# Patient Record
Sex: Male | Born: 1935 | Race: White | Hispanic: No | Marital: Married | Smoking: Former smoker
Health system: Southern US, Community
[De-identification: ages and names within clinical notes are randomized; demographics above are authoritative.]

## PROBLEM LIST (undated history)

## (undated) DIAGNOSIS — I4891 Unspecified atrial fibrillation: Secondary | ICD-10-CM

## (undated) DIAGNOSIS — I10 Essential (primary) hypertension: Secondary | ICD-10-CM

## (undated) DIAGNOSIS — Z9889 Other specified postprocedural states: Secondary | ICD-10-CM

## (undated) DIAGNOSIS — M1990 Unspecified osteoarthritis, unspecified site: Secondary | ICD-10-CM

## (undated) DIAGNOSIS — R112 Nausea with vomiting, unspecified: Secondary | ICD-10-CM

## (undated) DIAGNOSIS — E079 Disorder of thyroid, unspecified: Secondary | ICD-10-CM

## (undated) DIAGNOSIS — K219 Gastro-esophageal reflux disease without esophagitis: Secondary | ICD-10-CM

## (undated) DIAGNOSIS — H269 Unspecified cataract: Secondary | ICD-10-CM

## (undated) HISTORY — DX: Unspecified cataract: H26.9

## (undated) HISTORY — PX: FRACTURE SURGERY: SHX138

## (undated) HISTORY — PX: OTHER SURGICAL HISTORY: SHX169

## (undated) HISTORY — DX: Unspecified atrial fibrillation: I48.91

## (undated) HISTORY — DX: Disorder of thyroid, unspecified: E07.9

## (undated) HISTORY — DX: Unspecified osteoarthritis, unspecified site: M19.90

## (undated) HISTORY — PX: JOINT REPLACEMENT: SHX530

---

## 2006-01-19 ENCOUNTER — Ambulatory Visit (HOSPITAL_BASED_OUTPATIENT_CLINIC_OR_DEPARTMENT_OTHER): Admission: RE | Admit: 2006-01-19 | Discharge: 2006-01-19 | Payer: Self-pay | Admitting: Orthopedic Surgery

## 2006-10-26 HISTORY — PX: OTHER SURGICAL HISTORY: SHX169

## 2012-07-07 ENCOUNTER — Other Ambulatory Visit: Payer: Self-pay | Admitting: Orthopedic Surgery

## 2012-07-28 ENCOUNTER — Encounter (HOSPITAL_COMMUNITY)
Admission: RE | Admit: 2012-07-28 | Discharge: 2012-07-28 | Disposition: A | Discharge status: Home / Self Care | Payer: Medicare Other | Source: Ambulatory Visit | Attending: Orthopedic Surgery | Admitting: Orthopedic Surgery

## 2012-07-28 ENCOUNTER — Encounter (HOSPITAL_COMMUNITY): Payer: Self-pay

## 2012-07-28 HISTORY — DX: Essential (primary) hypertension: I10

## 2012-07-28 HISTORY — DX: Other specified postprocedural states: R11.2

## 2012-07-28 HISTORY — DX: Gastro-esophageal reflux disease without esophagitis: K21.9

## 2012-07-28 HISTORY — DX: Other specified postprocedural states: Z98.890

## 2012-07-28 LAB — CBC WITH DIFFERENTIAL/PLATELET
Basophils Relative: 1 % (ref 0–1)
Eosinophils Absolute: 0.2 10*3/uL (ref 0.0–0.7)
Eosinophils Relative: 4 % (ref 0–5)
MCH: 30.5 pg (ref 26.0–34.0)
MCHC: 33.9 g/dL (ref 30.0–36.0)
MCV: 90 fL (ref 78.0–100.0)
Monocytes Relative: 17 % — ABNORMAL HIGH (ref 3–12)
Neutrophils Relative %: 55 % (ref 43–77)
Platelets: 196 10*3/uL (ref 150–400)

## 2012-07-28 LAB — COMPREHENSIVE METABOLIC PANEL
Albumin: 4 g/dL (ref 3.5–5.2)
Alkaline Phosphatase: 70 U/L (ref 39–117)
BUN: 15 mg/dL (ref 6–23)
Calcium: 9.6 mg/dL (ref 8.4–10.5)
GFR calc Af Amer: >90 mL/min (ref >90)
Glucose, Bld: 111 mg/dL — ABNORMAL HIGH (ref 70–99)
Potassium: 4 mEq/L (ref 3.5–5.1)
Total Protein: 7.1 g/dL (ref 6.0–8.3)

## 2012-07-28 LAB — SURGICAL PCR SCREEN: MRSA, PCR: NEGATIVE

## 2012-07-28 LAB — URINALYSIS, ROUTINE W REFLEX MICROSCOPIC
Glucose, UA: NEGATIVE mg/dL
Ketones, ur: NEGATIVE mg/dL
Leukocytes, UA: NEGATIVE
Nitrite: NEGATIVE
Protein, ur: NEGATIVE mg/dL
Urobilinogen, UA: 0.2 mg/dL (ref 0.0–1.0)

## 2012-07-28 LAB — PROTIME-INR
INR: 0.99 (ref 0.00–1.49)
Prothrombin Time: 13 seconds (ref 11.6–15.2)

## 2012-07-28 LAB — APTT: aPTT: 34 seconds (ref 24–37)

## 2012-07-28 NOTE — Progress Notes (Signed)
Pt requested txm dos

## 2012-07-28 NOTE — Pre-Procedure Instructions (Signed)
20 Jared Benjamin  07/28/2012   Your procedure is scheduled on:  08/09/12  Report to [facility] at 530 AM.  Call this number if you have problems the morning of surgery: [phone]   Remember:   Do not eat food:After Midnight.    Take these medicines the morning of surgery with A SIP OF WATER: zantac   Do not wear jewelry, make-up or nail polish.  Do not wear lotions, powders, or perfumes. You may wear deodorant.  Do not shave 48 hours prior to surgery. Men may shave face and neck.  Do not bring valuables to the hospital.  Contacts, dentures or bridgework may not be worn into surgery.  Leave suitcase in the car. After surgery it may be brought to your room.  For patients admitted to the hospital, checkout time is 11:00 AM the day of discharge.   Patients discharged the day of surgery will not be allowed to drive home.  Name and phone number of your driver: [email or web address]  Special Instructions: Shower using CHG 2 nights before surgery and the night before surgery.  If you shower the day of surgery use CHG.  Use special wash - you have one bottle of CHG for all showers.  You should use approximately 1/3 of the bottle for each shower.   Please read over the following fact sheets that you were given: Pain Booklet, Coughing and Deep Breathing, Blood Transfusion Information, Total Joint Packet, MRSA Information and Surgical Site Infection Prevention

## 2012-07-29 ENCOUNTER — Other Ambulatory Visit (HOSPITAL_COMMUNITY): Payer: Self-pay

## 2012-08-08 MED ORDER — CEFAZOLIN SODIUM-DEXTROSE 2-3 GM-% IV SOLR
2.0000 g | INTRAVENOUS | Status: AC
  Administered 2012-08-09: 2 g via INTRAVENOUS
  Filled 2012-08-08: qty 50

## 2012-08-08 MED ORDER — POVIDONE-IODINE 7.5 % EX SOLN
Freq: Once | CUTANEOUS | Status: DC
  Filled 2012-08-08: qty 118

## 2012-08-09 ENCOUNTER — Encounter (HOSPITAL_COMMUNITY): Payer: Self-pay | Admitting: *Deleted

## 2012-08-09 ENCOUNTER — Encounter (HOSPITAL_COMMUNITY)
Admission: RE | Disposition: A | Discharge status: Home / Self Care | Payer: Self-pay | Source: Ambulatory Visit | Attending: Orthopedic Surgery

## 2012-08-09 ENCOUNTER — Inpatient Hospital Stay (HOSPITAL_COMMUNITY): Payer: Medicare Other

## 2012-08-09 ENCOUNTER — Encounter (HOSPITAL_COMMUNITY): Payer: Self-pay | Admitting: Certified Registered"

## 2012-08-09 ENCOUNTER — Inpatient Hospital Stay (HOSPITAL_COMMUNITY)
Admission: RE | Admit: 2012-08-09 | Discharge: 2012-08-11 | DRG: 484 | Disposition: A | Discharge status: Home / Self Care | Payer: Medicare Other | Source: Ambulatory Visit | Attending: Orthopedic Surgery | Admitting: Orthopedic Surgery

## 2012-08-09 ENCOUNTER — Inpatient Hospital Stay (HOSPITAL_COMMUNITY): Payer: Medicare Other | Admitting: Certified Registered"

## 2012-08-09 DIAGNOSIS — Z23 Encounter for immunization: Secondary | ICD-10-CM

## 2012-08-09 DIAGNOSIS — Z87891 Personal history of nicotine dependence: Secondary | ICD-10-CM

## 2012-08-09 DIAGNOSIS — Z7982 Long term (current) use of aspirin: Secondary | ICD-10-CM

## 2012-08-09 DIAGNOSIS — Z79899 Other long term (current) drug therapy: Secondary | ICD-10-CM

## 2012-08-09 DIAGNOSIS — M19019 Primary osteoarthritis, unspecified shoulder: Secondary | ICD-10-CM

## 2012-08-09 DIAGNOSIS — M19219 Secondary osteoarthritis, unspecified shoulder: Principal | ICD-10-CM | POA: Diagnosis present

## 2012-08-09 DIAGNOSIS — Z01812 Encounter for preprocedural laboratory examination: Secondary | ICD-10-CM

## 2012-08-09 DIAGNOSIS — I10 Essential (primary) hypertension: Secondary | ICD-10-CM | POA: Diagnosis present

## 2012-08-09 DIAGNOSIS — K219 Gastro-esophageal reflux disease without esophagitis: Secondary | ICD-10-CM | POA: Diagnosis present

## 2012-08-09 HISTORY — PX: TOTAL SHOULDER ARTHROPLASTY: SHX126

## 2012-08-09 LAB — ABO/RH: ABO/RH(D): O POS

## 2012-08-09 LAB — TYPE AND SCREEN: Antibody Screen: NEGATIVE

## 2012-08-09 SURGERY — ARTHROPLASTY, SHOULDER, TOTAL
Anesthesia: General | Site: Shoulder | Laterality: Left | Wound class: Clean

## 2012-08-09 MED ORDER — DEXAMETHASONE SODIUM PHOSPHATE 4 MG/ML IJ SOLN
INTRAMUSCULAR | Status: DC | PRN
  Administered 2012-08-09: 4 mg via INTRAVENOUS

## 2012-08-09 MED ORDER — MORPHINE SULFATE 2 MG/ML IJ SOLN: 2.0000 mg | INTRAMUSCULAR | Status: DC | PRN

## 2012-08-09 MED ORDER — ROCURONIUM BROMIDE 100 MG/10ML IV SOLN
INTRAVENOUS | Status: DC | PRN
  Administered 2012-08-09 (×3): 10 mg via INTRAVENOUS
  Administered 2012-08-09: 50 mg via INTRAVENOUS

## 2012-08-09 MED ORDER — ACETAMINOPHEN 650 MG RE SUPP: 650.0000 mg | Freq: Four times a day (QID) | RECTAL | Status: DC | PRN

## 2012-08-09 MED ORDER — CEFAZOLIN SODIUM-DEXTROSE 2-3 GM-% IV SOLR
2.0000 g | Freq: Four times a day (QID) | INTRAVENOUS | Status: AC
  Administered 2012-08-09 – 2012-08-10 (×3): 2 g via INTRAVENOUS
  Filled 2012-08-09 (×3): qty 50

## 2012-08-09 MED ORDER — PHENYLEPHRINE HCL 10 MG/ML IJ SOLN
INTRAMUSCULAR | Status: DC | PRN
  Administered 2012-08-09 (×2): 80 ug via INTRAVENOUS
  Administered 2012-08-09 (×4): 40 ug via INTRAVENOUS
  Administered 2012-08-09: 80 ug via INTRAVENOUS
  Administered 2012-08-09 (×2): 40 ug via INTRAVENOUS
  Administered 2012-08-09: 80 ug via INTRAVENOUS
  Administered 2012-08-09: 40 ug via INTRAVENOUS
  Administered 2012-08-09: 80 ug via INTRAVENOUS
  Administered 2012-08-09: 40 ug via INTRAVENOUS

## 2012-08-09 MED ORDER — FENTANYL CITRATE 0.05 MG/ML IJ SOLN
INTRAMUSCULAR | Status: DC | PRN
  Administered 2012-08-09 (×2): 100 ug via INTRAVENOUS

## 2012-08-09 MED ORDER — PROPOFOL 10 MG/ML IV BOLUS
INTRAVENOUS | Status: DC | PRN
  Administered 2012-08-09: 110 mg via INTRAVENOUS

## 2012-08-09 MED ORDER — ONDANSETRON HCL 4 MG/2ML IJ SOLN
INTRAMUSCULAR | Status: DC | PRN
  Administered 2012-08-09 (×2): 4 mg via INTRAVENOUS

## 2012-08-09 MED ORDER — METOCLOPRAMIDE HCL 10 MG PO TABS: 5.0000 mg | ORAL_TABLET | Freq: Three times a day (TID) | ORAL | Status: DC | PRN

## 2012-08-09 MED ORDER — MENTHOL 3 MG MT LOZG: 1.0000 | LOZENGE | OROMUCOSAL | Status: DC | PRN

## 2012-08-09 MED ORDER — OXYCODONE-ACETAMINOPHEN 5-325 MG PO TABS
1.0000 | ORAL_TABLET | ORAL | Status: DC | PRN
  Administered 2012-08-10: 1 via ORAL
  Filled 2012-08-09: qty 1

## 2012-08-09 MED ORDER — BUPIVACAINE-EPINEPHRINE PF 0.5-1:200000 % IJ SOLN
INTRAMUSCULAR | Status: DC | PRN
  Administered 2012-08-09: 30 mL

## 2012-08-09 MED ORDER — SODIUM CHLORIDE 0.9 % IV SOLN
INTRAVENOUS | Status: DC
  Administered 2012-08-09: 14:00:00 via INTRAVENOUS

## 2012-08-09 MED ORDER — FAMOTIDINE 20 MG PO TABS
20.0000 mg | ORAL_TABLET | Freq: Every day | ORAL | Status: DC
  Administered 2012-08-09 – 2012-08-11 (×3): 20 mg via ORAL
  Filled 2012-08-09 (×3): qty 1

## 2012-08-09 MED ORDER — MIDAZOLAM HCL 5 MG/5ML IJ SOLN
INTRAMUSCULAR | Status: DC | PRN
  Administered 2012-08-09: 2 mg via INTRAVENOUS

## 2012-08-09 MED ORDER — SIMVASTATIN 40 MG PO TABS
40.0000 mg | ORAL_TABLET | Freq: Every day | ORAL | Status: DC
  Administered 2012-08-09 – 2012-08-10 (×2): 40 mg via ORAL
  Filled 2012-08-09 (×3): qty 1

## 2012-08-09 MED ORDER — BENAZEPRIL HCL 20 MG PO TABS
20.0000 mg | ORAL_TABLET | Freq: Every day | ORAL | Status: DC
  Administered 2012-08-09 – 2012-08-10 (×2): 20 mg via ORAL
  Filled 2012-08-09 (×4): qty 1

## 2012-08-09 MED ORDER — INFLUENZA VIRUS VACC SPLIT PF IM SUSP
0.5000 mL | INTRAMUSCULAR | Status: AC
  Administered 2012-08-10: 0.5 mL via INTRAMUSCULAR
  Filled 2012-08-09: qty 0.5

## 2012-08-09 MED ORDER — ONDANSETRON HCL 4 MG/2ML IJ SOLN: 4.0000 mg | Freq: Four times a day (QID) | INTRAMUSCULAR | Status: DC | PRN

## 2012-08-09 MED ORDER — LACTATED RINGERS IV SOLN
INTRAVENOUS | Status: DC | PRN
  Administered 2012-08-09 (×3): via INTRAVENOUS

## 2012-08-09 MED ORDER — ONDANSETRON HCL 4 MG PO TABS: 4.0000 mg | ORAL_TABLET | Freq: Four times a day (QID) | ORAL | Status: DC | PRN

## 2012-08-09 MED ORDER — MEPERIDINE HCL 25 MG/ML IJ SOLN: 6.2500 mg | INTRAMUSCULAR | Status: DC | PRN

## 2012-08-09 MED ORDER — SENNA 8.6 MG PO TABS
1.0000 | ORAL_TABLET | Freq: Two times a day (BID) | ORAL | Status: DC
  Administered 2012-08-09 – 2012-08-10 (×4): 8.6 mg via ORAL
  Filled 2012-08-09 (×6): qty 1

## 2012-08-09 MED ORDER — METOCLOPRAMIDE HCL 5 MG/ML IJ SOLN
5.0000 mg | Freq: Three times a day (TID) | INTRAMUSCULAR | Status: DC | PRN
  Administered 2012-08-09: 10 mg via INTRAVENOUS
  Filled 2012-08-09: qty 2

## 2012-08-09 MED ORDER — EPHEDRINE SULFATE 50 MG/ML IJ SOLN
INTRAMUSCULAR | Status: DC | PRN
  Administered 2012-08-09: 10 mg via INTRAVENOUS
  Administered 2012-08-09: 5 mg via INTRAVENOUS
  Administered 2012-08-09: 10 mg via INTRAVENOUS
  Administered 2012-08-09: 5 mg via INTRAVENOUS
  Administered 2012-08-09: 10 mg via INTRAVENOUS
  Administered 2012-08-09 (×2): 5 mg via INTRAVENOUS

## 2012-08-09 MED ORDER — ACETAMINOPHEN 325 MG PO TABS
650.0000 mg | ORAL_TABLET | Freq: Four times a day (QID) | ORAL | Status: DC | PRN
  Administered 2012-08-10 – 2012-08-11 (×3): 650 mg via ORAL
  Filled 2012-08-09 (×3): qty 2

## 2012-08-09 MED ORDER — NEOSTIGMINE METHYLSULFATE 1 MG/ML IJ SOLN
INTRAMUSCULAR | Status: DC | PRN
  Administered 2012-08-09: 3 mg via INTRAVENOUS

## 2012-08-09 MED ORDER — SODIUM CHLORIDE 0.9 % IR SOLN
Status: DC | PRN
  Administered 2012-08-09: 1000 mL
  Administered 2012-08-09: 3000 mL

## 2012-08-09 MED ORDER — FLEET ENEMA 7-19 GM/118ML RE ENEM: 1.0000 | ENEMA | Freq: Once | RECTAL | Status: AC | PRN

## 2012-08-09 MED ORDER — ARTIFICIAL TEARS OP OINT
TOPICAL_OINTMENT | OPHTHALMIC | Status: DC | PRN
  Administered 2012-08-09: 1 via OPHTHALMIC

## 2012-08-09 MED ORDER — BISACODYL 5 MG PO TBEC: 5.0000 mg | DELAYED_RELEASE_TABLET | Freq: Every day | ORAL | Status: DC | PRN

## 2012-08-09 MED ORDER — GLYCOPYRROLATE 0.2 MG/ML IJ SOLN
INTRAMUSCULAR | Status: DC | PRN
  Administered 2012-08-09: 0.4 mg via INTRAVENOUS

## 2012-08-09 MED ORDER — HYDROMORPHONE HCL PF 1 MG/ML IJ SOLN
0.2500 mg | INTRAMUSCULAR | Status: DC | PRN
Start: 2012-08-09 — End: 2012-08-09

## 2012-08-09 MED ORDER — ONDANSETRON HCL 4 MG/2ML IJ SOLN: 4.0000 mg | Freq: Once | INTRAMUSCULAR | Status: DC | PRN

## 2012-08-09 MED ORDER — LIDOCAINE HCL (CARDIAC) 20 MG/ML IV SOLN
INTRAVENOUS | Status: DC | PRN
  Administered 2012-08-09: 100 mg via INTRAVENOUS

## 2012-08-09 MED ORDER — ALUM & MAG HYDROXIDE-SIMETH 200-200-20 MG/5ML PO SUSP: 30.0000 mL | ORAL | Status: DC | PRN

## 2012-08-09 MED ORDER — ZOLPIDEM TARTRATE 5 MG PO TABS: 5.0000 mg | ORAL_TABLET | Freq: Every evening | ORAL | Status: DC | PRN

## 2012-08-09 MED ORDER — HYDROCODONE-ACETAMINOPHEN 5-325 MG PO TABS: 1.0000 | ORAL_TABLET | ORAL | Status: DC | PRN

## 2012-08-09 MED ORDER — ASPIRIN EC 325 MG PO TBEC
325.0000 mg | DELAYED_RELEASE_TABLET | Freq: Two times a day (BID) | ORAL | Status: DC
  Administered 2012-08-09 – 2012-08-10 (×4): 325 mg via ORAL
  Filled 2012-08-09 (×7): qty 1

## 2012-08-09 MED ORDER — OXYCODONE HCL 5 MG PO TABS: 5.0000 mg | ORAL_TABLET | Freq: Once | ORAL | Status: DC | PRN

## 2012-08-09 MED ORDER — ALUM & MAG HYDROXIDE-SIMETH 200-200-20 MG/5ML PO SUSP
30.0000 mL | Freq: Four times a day (QID) | ORAL | Status: DC | PRN
  Administered 2012-08-09: 30 mL via ORAL
  Filled 2012-08-09: qty 30

## 2012-08-09 MED ORDER — OXYCODONE HCL 5 MG/5ML PO SOLN: 5.0000 mg | Freq: Once | ORAL | Status: DC | PRN

## 2012-08-09 MED ORDER — PHENOL 1.4 % MT LIQD: 1.0000 | OROMUCOSAL | Status: DC | PRN

## 2012-08-09 MED ORDER — HEMOSTATIC AGENTS (NO CHARGE) OPTIME
TOPICAL | Status: DC | PRN
  Administered 2012-08-09: 1 via TOPICAL

## 2012-08-09 MED ORDER — SENNOSIDES-DOCUSATE SODIUM 8.6-50 MG PO TABS: 1.0000 | ORAL_TABLET | Freq: Every evening | ORAL | Status: DC | PRN

## 2012-08-09 MED ORDER — PNEUMOCOCCAL VAC POLYVALENT 25 MCG/0.5ML IJ INJ
0.5000 mL | INJECTION | INTRAMUSCULAR | Status: AC
  Filled 2012-08-09: qty 0.5

## 2012-08-09 MED ORDER — DIPHENHYDRAMINE HCL 12.5 MG/5ML PO ELIX: 12.5000 mg | ORAL_SOLUTION | ORAL | Status: DC | PRN

## 2012-08-09 SURGICAL SUPPLY — 70 items
BLADE SAW SAG 73X25 THK (BLADE) ×1
BLADE SAW SGTL 73X25 THK (BLADE) ×1 IMPLANT
BLADE SURG 15 STRL LF DISP TIS (BLADE) ×1 IMPLANT
BLADE SURG 15 STRL SS (BLADE) ×4
BOWL SMART MIX CTS (DISPOSABLE) IMPLANT
CEMENT BONE DEPUY (Cement) ×2 IMPLANT
CHLORAPREP W/TINT 26ML (MISCELLANEOUS) ×2 IMPLANT
CLOTH BEACON ORANGE TIMEOUT ST (SAFETY) ×2 IMPLANT
CLSR STERI-STRIP ANTIMIC 1/2X4 (GAUZE/BANDAGES/DRESSINGS) ×1 IMPLANT
COVER SURGICAL LIGHT HANDLE (MISCELLANEOUS) ×2 IMPLANT
DRAPE INCISE IOBAN 66X45 STRL (DRAPES) ×3 IMPLANT
DRAPE SURG 17X23 STRL (DRAPES) ×2 IMPLANT
DRAPE U-SHAPE 47X51 STRL (DRAPES) ×2 IMPLANT
DRSG ADAPTIC 3X8 NADH LF (GAUZE/BANDAGES/DRESSINGS) ×2 IMPLANT
DRSG MEPILEX BORDER 4X12 (GAUZE/BANDAGES/DRESSINGS) ×1 IMPLANT
DRSG PAD ABDOMINAL 8X10 ST (GAUZE/BANDAGES/DRESSINGS) ×3 IMPLANT
ELECT BLADE 4.0 EZ CLEAN MEGAD (MISCELLANEOUS) ×2
ELECT REM PT RETURN 9FT ADLT (ELECTROSURGICAL) ×2
ELECTRODE BLDE 4.0 EZ CLN MEGD (MISCELLANEOUS) ×1 IMPLANT
ELECTRODE REM PT RTRN 9FT ADLT (ELECTROSURGICAL) ×1 IMPLANT
EVACUATOR 1/8 PVC DRAIN (DRAIN) ×2 IMPLANT
GLOVE BIO SURGEON STRL SZ7 (GLOVE) ×2 IMPLANT
GLOVE BIO SURGEON STRL SZ7.5 (GLOVE) ×2 IMPLANT
GLOVE BIOGEL PI IND STRL 7.0 (GLOVE) ×1 IMPLANT
GLOVE BIOGEL PI IND STRL 8 (GLOVE) ×1 IMPLANT
GLOVE BIOGEL PI INDICATOR 7.0 (GLOVE) ×1
GLOVE BIOGEL PI INDICATOR 8 (GLOVE) ×1
GOWN PREVENTION PLUS LG XLONG (DISPOSABLE) ×2 IMPLANT
GOWN STRL REIN XL XLG (GOWN DISPOSABLE) ×2 IMPLANT
HANDPIECE INTERPULSE COAX TIP (DISPOSABLE) ×2
HEMOSTAT SURGICEL 2X14 (HEMOSTASIS) ×2 IMPLANT
HOOD PEEL AWAY FACE SHEILD DIS (HOOD) ×4 IMPLANT
KIT BASIN OR (CUSTOM PROCEDURE TRAY) ×2 IMPLANT
KIT ROOM TURNOVER OR (KITS) ×2 IMPLANT
MANIFOLD NEPTUNE II (INSTRUMENTS) ×2 IMPLANT
NDL HYPO 25GX1X1/2 BEV (NEEDLE) ×1 IMPLANT
NDL MAYO TROCAR (NEEDLE) ×1 IMPLANT
NEEDLE HYPO 25GX1X1/2 BEV (NEEDLE) ×2 IMPLANT
NEEDLE MAYO TROCAR (NEEDLE) ×2 IMPLANT
NS IRRIG 1000ML POUR BTL (IV SOLUTION) ×2 IMPLANT
PACK SHOULDER (CUSTOM PROCEDURE TRAY) ×2 IMPLANT
PAD ARMBOARD 7.5X6 YLW CONV (MISCELLANEOUS) ×4 IMPLANT
SET HNDPC FAN SPRY TIP SCT (DISPOSABLE) ×1 IMPLANT
SLING ARM IMMOBILIZER LRG (SOFTGOODS) ×2 IMPLANT
SLING ARM IMMOBILIZER MED (SOFTGOODS) IMPLANT
SLING SWATHE LARGE (SOFTGOODS) ×1 IMPLANT
SMARTMIX MINI TOWER (MISCELLANEOUS) ×2
SPONGE GAUZE 4X4 12PLY (GAUZE/BANDAGES/DRESSINGS) ×2 IMPLANT
SPONGE LAP 18X18 X RAY DECT (DISPOSABLE) ×2 IMPLANT
SPONGE LAP 4X18 X RAY DECT (DISPOSABLE) ×6 IMPLANT
STRIP CLOSURE SKIN 1/2X4 (GAUZE/BANDAGES/DRESSINGS) ×2 IMPLANT
SUCTION FRAZIER TIP 10 FR DISP (SUCTIONS) ×2 IMPLANT
SUPPORT WRAP ARM LG (MISCELLANEOUS) ×2 IMPLANT
SUT ETHIBOND 2 OS 4 DA (SUTURE) ×9 IMPLANT
SUT ETHIBOND NAB CT1 #1 30IN (SUTURE) ×2 IMPLANT
SUT MNCRL AB 4-0 PS2 18 (SUTURE) ×2 IMPLANT
SUT SILK 2 0 TIES 17X18 (SUTURE) ×2
SUT SILK 2-0 18XBRD TIE BLK (SUTURE) ×1 IMPLANT
SUT VIC AB 0 CTB1 27 (SUTURE) ×2 IMPLANT
SUT VIC AB 2-0 CT1 27 (SUTURE) ×2
SUT VIC AB 2-0 CT1 TAPERPNT 27 (SUTURE) ×1 IMPLANT
SYR CONTROL 10ML LL (SYRINGE) ×2 IMPLANT
SYRINGE TOOMEY DISP (SYRINGE) ×2 IMPLANT
TAPE CLOTH SURG 6X10 WHT LF (GAUZE/BANDAGES/DRESSINGS) ×1 IMPLANT
TOWEL OR 17X24 6PK STRL BLUE (TOWEL DISPOSABLE) ×2 IMPLANT
TOWEL OR 17X26 10 PK STRL BLUE (TOWEL DISPOSABLE) ×2 IMPLANT
TOWER SMARTMIX MINI (MISCELLANEOUS) ×1 IMPLANT
TRAY FOLEY CATH 14FR (SET/KITS/TRAYS/PACK) ×1 IMPLANT
WATER STERILE IRR 1000ML POUR (IV SOLUTION) ×2 IMPLANT
YANKAUER SUCT BULB TIP NO VENT (SUCTIONS) ×2 IMPLANT

## 2012-08-09 NOTE — Transfer of Care (Signed)
Immediate Anesthesia Transfer of Care Note  Patient: Jared Benjamin  Procedure(s) Performed: Procedure(s) (LRB) with comments: TOTAL SHOULDER ARTHROPLASTY (Left)  Patient Location: PACU  Anesthesia Type: General  Level of Consciousness: awake, oriented and responds to stimulation  Airway & Oxygen Therapy: Patient Spontanous Breathing and Patient connected to nasal cannula oxygen  Post-op Assessment: Report given to PACU RN  Post vital signs: Reviewed and stable  Complications: No apparent anesthesia complications

## 2012-08-09 NOTE — Preoperative (Signed)
Beta Blockers   Reason not to administer Beta Blockers:Not Applicable 

## 2012-08-09 NOTE — Op Note (Signed)
Procedure(s): TOTAL SHOULDER ARTHROPLASTY Procedure Note  Jared Benjamin male 77 y.o. 08/09/2012  Procedure(s) and Anesthesia Type:    * LEFT TOTAL SHOULDER ARTHROPLASTY - General with preoperative interscalene block  Surgeon(s) and Role:    * Mable Paris, MD - Primary   Indications:  77 y.o. male  With endstage left shoulder arthritis status post ORIF of a proximal humerus fracture 13 years ago.. Pain and dysfunction interfered with quality of life and nonoperative treatment with activity modification, NSAIDS and injections failed. X-rays show severe joint space narrowing and significant osteophyte formation.     Surgeon: Mable Paris   Assistants: Damita Lack PA-C (Danielle was scrubbed throughout the procedure and was essential for exposure retraction and positioning and closure)  Anesthesia: General endotracheal anesthesia with preoperative interscalene block    Procedure Detail  TOTAL SHOULDER ARTHROPLASTY  Findings: DePuy size 8 press-fit porocoat stem with a size 48 x 15 eccentric head and a 48 anchor peg glenoid cemented. The stem had to be placed slightly anteriorly and slightly varus that his anatomy status post proximal humerus fracture. Soft tissue tension was excellent at the conclusion of the procedure. A lesser tuberosity osteotomy was performed. Bone quality was excellent.  Estimated Blood Loss:  200 mL         Drains: 1 medium hemovac  Blood Given: none          Specimens: none        Complications:  * No complications entered in OR log *         Disposition: PACU - hemodynamically stable.         Condition: stable    Procedure:   The patient was identified in the preoperative holding area where I personally marked the operative extremity after verifying with the patient and consent. He  was taken to the operating room where He was transferred to the   operative table.  The patient received an interscalene block in   the  holding area by the attending anesthesiologist.  General anesthesia was induced   in the operating room without complication.  The patient did receive IV  Ancef prior to the commencement of the procedure.  The patient was   placed in the beach-chair position with the back raised about 30   degrees.  The nonoperative extremity and head and neck were carefully   positioned and padded protecting against neurovascular compromise.  The   left upper extremity was then prepped and draped in the standard sterile   fashion.    The appropriate operative time-out was performed with   Anesthesia, the perioperative staff, as well as myself and we all agreed   that the left side was the correct operative site.  An approximately   12 cm incision was made from the tip of the coracoid to the center point of the   humerus at the level of the axilla. His previous incision was marked out and used. Dissection was carried down sharply   through subcutaneous tissues and the deltopectoral interval was identified , but no identifiable cephalic vein was present..  The anatomic layers were carefully dissected. The pectoralis major was taken medially.  The   upper 1 cm of the pectoralis major was released from its attachment on   the humerus.  The clavipectoral fascia was incised just lateral to the   conjoined tendon.  This incision was carried up to but not into the   coracoacromial ligament.  Digital palpation was used to  prove   integrity of the axillary nerve which was protected throughout the   procedure.  Musculocutaneous nerve was not palpated in the operative   field.  Conjoined tendon was then retracted gently medially and the   deltoid laterally.  Anterior circumflex humeral vessels were not able to be identified.  The soft tissues overlying the biceps was incised and this   incision was carried across the transverse humeral ligament to the base   of the coracoid.  The biceps was tenodesed to the soft tissue  just above   pectoralis major and the remaining portion of the biceps superiorly was   excised.  An osteotomy was performed at the lesser tuberosity and the   subscapularis was freed from the underlying capsule.  Capsule was then   released all the way down to the 6 o'clock position of the humeral head. He had a large inferior humeral head osteophyte.  The humeral head was then delivered with simultaneous adduction,   extension and external rotation.  All humeral osteophytes were removed   and the anatomic neck of the humerus was marked and cut free hand at   approximately 25 degrees retroversion within about 2 mm of the cuff   reflection posteriorly.  The head size was estimated to be a 48 medium   offset.  At that point, the humeral head was retracted posteriorly with   a Fukuda retractor and the anterior-inferior capsule was excised.   Remaining portion of the capsule was released at the base of the   coracoid.  The remaining biceps anchor and the entire anterior-inferior   labrum was excised.  The posterior labrum was also excised but the   posterior capsule was not released.  The guidepin was placed bicortically with 0 elevated guide.  The reamer was used to ream to concentric bone with punctate bleeding.  This gave an excellent concentric surface.  The center hole was then drilled for an anchor peg glenoid followed by the three peripheral holes and none of the holes   exited the glenoid wall.  I then pulse irrigated these holes and dried   them with Surgicel.  The three peripheral holes were then   pressurized cemented and the anchor peg glenoid was placed and impacted   with an excellent fit.  The glenoid was a 48 component.  The proximal humerus was then again exposed taking care not to displace the glenoid.    The humerus was then sequentially reamed going from 6 to 8 by 2 mm incriments.  The entry hole was made slightly anterior given the abnormal anatomy. The 8 mm reamer was found to  have appropriate cortical contact.  A   box osteotome was then used and a 8-mm broach.  The broach handle was   removed and the trial head was placed.   Calcar reamer was used.The eccentric  40 x 15 head fit best.  With the trial implantation of the component, there was   approximately 50% posterior translation with immediate snap back to the   anatomic position.  The 48 x 18 trial was a bit too tight. With forward elevation, there was no tendency   towards posterior subluxation.   The trial was removed and the final implant was prepared on a back table.  The implant was then impacted and   achieved excellent anatomic reconstruction of the proximal humerus.  #2 Ethibond was placed around the neck prior to impaction.  The joint   was then  copiously irrigated with pulse lavage.  The subscapularis and   lesser tuberosity osteotomy were then repaired using 2 #2 Ethibonds   and the #2 Ethibond around the neck of the implant in a double row type   repair.  One #1 Ethibond was placed at the rotator interval just above   the lesser tuberosity.  After repair of the lesser tuberosity, a medium   Hemovac was placed out anterolaterally and again copious irrigation was   used.   Skin was closed with 2-0 Vicryl sutures in the deep dermal layer and 4-0 Monocryl in a subcuticular  running fashion.  Sterile dressings were then applied including Steri- Strips, 4x4s, ABDs and tape.  The patient was placed in a sling and allowed to awaken from general anesthesia and taken to the recovery room in stable  condition.      POSTOPERATIVE PLAN:  Early passive range of motion will be allowed with the goal of 0 degrees external rotation to protect the subscapularis repair and a 130 degrees forward elevation.  No internal rotation at this time.  No active motion of the arm until the lesser tuberosity heels.  The patient will likely be kept in the hospital for 1-2 days and then discharged home.

## 2012-08-09 NOTE — H&P (Signed)
Jared Benjamin is an 77 y.o. male.   Chief Complaint: L shoulder pain HPI: S/p ORIF L prox hum fx in 1990, with subsequent OA.  Now with significant constant pain which has failed NSAIDs, activity modification and injection therapy.  XRS show severe joint space narrowing.  Past Medical History  Diagnosis Date  . PONV (postoperative nausea and vomiting)   . Hypertension     dr c Jared Benjamin  . GERD (gastroesophageal reflux disease)     Past Surgical History  Procedure Date  . Bone spurs     off toes  . Fell     shoulder X 2 surgeried re. to fall  . Joint replacement     broke rt ankle--fall  . Fractured ribs     from [facility]    History reviewed. No pertinent family history. Social History:  reports that he has quit smoking. He quit smokeless tobacco use about 34 years ago. He reports that he drinks alcohol. His drug history not on file.  Allergies: No Known Allergies  Medications Prior to Admission  Medication Sig Dispense Refill  . aspirin EC 81 MG tablet Take 81 mg by mouth daily.      . benazepril (LOTENSIN) 20 MG tablet Take 20 mg by mouth daily.      . LUTEIN PO Take 1 tablet by mouth daily.      . ranitidine (ZANTAC) 150 MG tablet Take 150 mg by mouth daily as needed. For acid reflux      . simvastatin (ZOCOR) 40 MG tablet Take 40 mg by mouth at bedtime.        Results for orders placed during the hospital encounter of 08/09/12 (from the past 48 hour(s))  TYPE AND SCREEN     Status: Normal (Preliminary result)   Collection Time   08/09/12  6:30 AM      Component Value Range Comment   ABO/RH(D) O POS      Antibody Screen PENDING      Sample Expiration 08/12/2012      No results found.  Review of Systems  All other systems reviewed and are negative.    Blood pressure 160/84, pulse 73, temperature 97.8 F (36.6 C), temperature source Oral, resp. rate 18, SpO2 98.00%. Physical Exam  Constitutional: He is oriented to person, place, and time. He appears well-developed  and well-nourished.  HENT:  Head: Atraumatic.  Eyes: EOM are normal.  Cardiovascular: Intact distal pulses.   Respiratory: Effort normal.  Musculoskeletal:       Left shoulder: He exhibits decreased range of motion, tenderness and pain.  Neurological: He is alert and oriented to person, place, and time.  Skin: Skin is warm and dry.  Psychiatric: He has a normal mood and affect.     Assessment/Plan L shoulder post traumatic OA, failed conservative treatment Plan L TSA Risks / benefits of surgery discussed Consent on chart  NPO for OR Preop antibiotics   Jared Benjamin 08/09/2012, 7:15 AM

## 2012-08-09 NOTE — Anesthesia Preprocedure Evaluation (Addendum)
Anesthesia Evaluation  Patient identified by MRN, date of birth, ID band Patient awake    Reviewed: Allergy & Precautions, H&P , NPO status , Patient's Chart, lab work & pertinent test results  History of Anesthesia Complications (+) PONV  Airway Mallampati: II TM Distance: >3 FB Neck ROM: Full    Dental  (+) Teeth Intact and Dental Advisory Given   Pulmonary  breath sounds clear to auscultation        Cardiovascular hypertension, Pt. on medications Rhythm:Regular Rate:Normal     Neuro/Psych    GI/Hepatic GERD-  Controlled,  Endo/Other    Renal/GU      Musculoskeletal   Abdominal   Peds  Hematology   Anesthesia Other Findings   Reproductive/Obstetrics                           Anesthesia Physical Anesthesia Plan  ASA: II  Anesthesia Plan: General   Post-op Pain Management:    Induction: Intravenous  Airway Management Planned: Oral ETT  Additional Equipment:   Intra-op Plan:   Post-operative Plan: Extubation in OR  Informed Consent: I have reviewed the patients History and Physical, chart, labs and discussed the procedure including the risks, benefits and alternatives for the proposed anesthesia with the patient or authorized representative who has indicated his/her understanding and acceptance.     Plan Discussed with: CRNA and Surgeon  Anesthesia Plan Comments:         Anesthesia Quick Evaluation

## 2012-08-09 NOTE — Anesthesia Procedure Notes (Addendum)
Anesthesia Regional Block:  Interscalene brachial plexus block  Pre-Anesthetic Checklist: ,, timeout performed, Correct Patient, Correct Site, Correct Laterality, Correct Procedure, Correct Position, site marked, Risks and benefits discussed,  Surgical consent,  Pre-op evaluation,  At surgeon's request and post-op pain management  Laterality: Left  Prep: chloraprep       Needles:  Injection technique: Single-shot  Needle Type: Echogenic Stimulator Needle     Needle Length: 5cm 5 cm     Additional Needles:  Procedures: ultrasound guided and nerve stimulator Interscalene brachial plexus block  Nerve Stimulator or Paresthesia:  Response: 0.4 mA,   Additional Responses:   Narrative:  Start time: 08/09/2012 7:10 AM End time: 08/09/2012 7:25 AM Injection made incrementally with aspirations every 5 mL. Anesthesiologist: Arta Bruce MD  Additional Notes: Monitors applied. Patient sedated. Sterile prep and drape,hand hygiene and sterile gloves were used. Relevant anatomy identified.Needle position confirmed.Local anesthetic injected incrementally after negative aspiration. Local anesthetic spread visualized around nerve(s). Vascular puncture avoided. No complications. Image printed for medical record.The patient tolerated the procedure well.       Interscalene brachial plexus block Procedure Name: Intubation Date/Time: 08/09/2012 7:45 AM Performed by: Jefm Miles E Pre-anesthesia Checklist: Patient identified, Timeout performed, Emergency Drugs available, Suction available and Patient being monitored Patient Re-evaluated:Patient Re-evaluated prior to inductionOxygen Delivery Method: Circle system utilized Preoxygenation: Pre-oxygenation with 100% oxygen Intubation Type: IV induction Ventilation: Mask ventilation without difficulty Laryngoscope Size: Mac and 3 Grade View: Grade II Tube type: Oral Tube size: 7.5 mm Number of attempts: 1 Airway Equipment and Method:  Stylet Placement Confirmation: ETT inserted through vocal cords under direct vision,  breath sounds checked- equal and bilateral and positive ETCO2 Secured at: 23 cm Tube secured with: Tape Dental Injury: Teeth and Oropharynx as per pre-operative assessment

## 2012-08-09 NOTE — Plan of Care (Signed)
Problem: Diagnosis - Type of Surgery Goal: General Surgical Patient Education (See Patient Education module for education specifics) Left Total Shoulder

## 2012-08-09 NOTE — Anesthesia Postprocedure Evaluation (Signed)
Anesthesia Post Note  Patient: Jared Benjamin  Procedure(s) Performed: Procedure(s) (LRB): TOTAL SHOULDER ARTHROPLASTY (Left)  Anesthesia type: general  Patient location: PACU  Post pain: Pain level controlled  Post assessment: Patient's Cardiovascular Status Stable  Last Vitals:  Filed Vitals:   08/09/12 1130  BP:   Pulse: 71  Temp:   Resp: 14    Post vital signs: Reviewed and stable  Level of consciousness: sedated  Complications: No apparent anesthesia complications

## 2012-08-10 LAB — BASIC METABOLIC PANEL
BUN: 10 mg/dL (ref 6–23)
CO2: 24 mEq/L (ref 19–32)
Calcium: 7 mg/dL — ABNORMAL LOW (ref 8.4–10.5)
Chloride: 111 mEq/L (ref 96–112)
Creatinine, Ser: 0.71 mg/dL (ref 0.50–1.35)

## 2012-08-10 LAB — CBC
HCT: 31 % — ABNORMAL LOW (ref 39.0–52.0)
MCH: 29 pg (ref 26.0–34.0)
MCV: 89.9 fL (ref 78.0–100.0)
RBC: 3.45 MIL/uL — ABNORMAL LOW (ref 4.22–5.81)
RDW: 14 % (ref 11.5–15.5)
WBC: 8.2 10*3/uL (ref 4.0–10.5)

## 2012-08-10 NOTE — Evaluation (Signed)
Occupational Therapy Evaluation Patient Details Name: Jared Benjamin MRN: [id number] DOB: 12/02/1934 Today's Date: 08/10/2012 Time: 2130-8657 OT Time Calculation (min): 37 min  OT Assessment / Plan / Recommendation Clinical Impression  Pt is recovering from a L TSA.  Instruction provided in ADL, positioning, sling use, AROM L elbow to hand, FF ROM in supine and restricted movements.  Will continue to follow.    OT Assessment  Patient needs continued OT Services    Follow Up Recommendations  Supervision - Intermittent    Barriers to Discharge      Equipment Recommendations  None recommended by OT    Recommendations for Other Services    Frequency  Min 2X/week    Precautions / Restrictions Precautions Precautions: Shoulder;Fall Type of Shoulder Precautions: NO ER, FF in supine to 130-140 only Precaution Booklet Issued: Yes (comment) Restrictions Weight Bearing Restrictions: Yes LUE Weight Bearing: Non weight bearing Other Position/Activity Restrictions: sling at all times except with bathing/dressing and exercises   Pertinent Vitals/Pain 3/10 L shoulder with exercises, 0/10 upon returning to supine    ADL  Eating/Feeding: Simulated;Set up Where Assessed - Eating/Feeding: Edge of bed Grooming: Performed;Wash/dry hands;Wash/dry face;Set up Where Assessed - Grooming: Unsupported sitting Upper Body Bathing: Simulated;Minimal assistance Where Assessed - Upper Body Bathing: Unsupported sitting Lower Body Bathing: Simulated;Supervision/safety Where Assessed - Lower Body Bathing: Supported sit to stand Upper Body Dressing: Performed;Minimal assistance Where Assessed - Upper Body Dressing: Unsupported sitting Lower Body Dressing: Performed;Supervision/safety Where Assessed - Lower Body Dressing: Supported sit to stand Equipment Used: Other (comment) (sling) Transfers/Ambulation Related to ADLs: Pt declined sitting in chair, supervision to reposition in bed  ADL Comments:  Instructed in hemitechniques for bathing and dressing, donning and doffing sling, positioning of L UE in bed and in chair.    OT Diagnosis: Generalized weakness;Acute pain  OT Problem List: Impaired UE functional use;Decreased knowledge of precautions;Decreased range of motion;Pain OT Treatment Interventions: Self-care/ADL training;Therapeutic exercise;Patient/family education   OT Goals Acute Rehab OT Goals OT Goal Formulation: With patient Time For Goal Achievement: 08/17/12 Potential to Achieve Goals: Good ADL Goals Pt Will Perform Upper Body Bathing: with supervision;Standing at sink ADL Goal: Upper Body Bathing - Progress: Goal set today Pt Will Perform Upper Body Dressing: with supervision;Sitting, bed ADL Goal: Upper Body Dressing - Progress: Goal set today Arm Goals Pt Will Perform AROM: 10 reps;to maintain range of motion;Independently (elbow to hand on L) Arm Goal: AROM - Progress: Goal set today Additional Arm Goal #1: Pt will be independent in FF ROM exercises of L shoulder in supine x 10 reps. Arm Goal: Additional Goal #1 - Progress: Goal set today Miscellaneous OT Goals Miscellaneous OT Goal #1: Pt and wife will be independent in L UE sling use and positioning in bed and chair. OT Goal: Miscellaneous Goal #1 - Progress: Goal set today  Visit Information  Last OT Received On: 08/10/12 Assistance Needed: +1    Subjective Data  Subjective: "This is overwhelming." Patient Stated Goal: Regain use of L shoulder.   Prior Functioning     Home Living Lives With: Spouse Available Help at Discharge: Family;Available 24 hours/day Type of Home: House Bathroom Shower/Tub: Walk-in shower;Door Dentist: None Prior Function Level of Independence: Independent Able to Take Stairs?: Yes Driving: Yes Vocation: Retired Comments: enjoys Data processing manager, singing in choir Communication Communication: No difficulties Dominant Hand: Right  (pt is somewhat ambidextrous)         Vision/Perception     IT consultant  Overall Cognitive Status: Appears within functional limits for tasks assessed/performed Arousal/Alertness: Awake/alert Orientation Level: Appears intact for tasks assessed Behavior During Session: [facility] for tasks performed    Extremity/Trunk Assessment Right Upper Extremity Assessment RUE ROM/Strength/Tone: Within functional levels Left Upper Extremity Assessment LUE ROM/Strength/Tone: Unable to fully assess;Due to precautions (shoulder AAROM in supine tolerated x 10 to 80 degrees ) Trunk Assessment Trunk Assessment: Normal     Mobility Bed Mobility Bed Mobility: Supine to Sit;Sit to Supine Supine to Sit: 6: Modified independent (Device/Increase time) Sit to Supine: 6: Modified independent (Device/Increase time) Transfers Transfers: Sit to Stand;Stand to Sit Sit to Stand: 5: Supervision;From bed Stand to Sit: 5: Supervision;To bed     Shoulder Instructions     Exercise     Balance     End of Session OT - End of Session Activity Tolerance: Patient tolerated treatment well Patient left: in bed;with call bell/phone within reach;Other (comment);with family/visitor present Radio producer in room)  GO     Evern Bio 08/10/2012, 10:51 AM [phone]

## 2012-08-10 NOTE — Progress Notes (Signed)
Referral received for SNF. Chart reviewed and CSW has spoken with [facility] who indicates that patient is for DC to home at d/c.  No current OT recommendations indicated.   CSW to sign off. Please re-consult if CSW needs arise.  Lorri Frederick. [place]  [phone]

## 2012-08-10 NOTE — Progress Notes (Signed)
PATIENT ID: Jared Benjamin   1 Day Post-Op Procedure(s) (LRB): TOTAL SHOULDER ARTHROPLASTY (Left)  Subjective: Feeling great. Up in bed. No complaints or concerns.   Objective:  Filed Vitals:   08/10/12 0718  BP: 132/54  Pulse: 82  Temp: 98.6 F (37 C)  Resp: 18     Alert and orientated L UE dressing with small drops of dry blood, intact Drain removed today Wiggles fingers, intact sensation to light touch distally, r/u/m nerves intact Fires deltoid well ER to neutral passively  Labs:  No results found for this basename: HGB:5 in the last 72 hoursNo results found for this basename: WBC:2,RBC:2,HCT:2,PLT:2 in the last 72 hoursNo results found for this basename: NA:2,K:2,CL:2,CO2:2,BUN:2,CREATININE:2,GLUCOSE:2,CALCIUM:2 in the last 72 hours  Assessment and Plan: Will work with OT today on PROM exercises, 130 degrees FF, 0 degrees ER Labs pending Continue current pain mgmt  VTE proph: ASA 325 BID, SCDs

## 2012-08-11 ENCOUNTER — Encounter (HOSPITAL_COMMUNITY): Payer: Self-pay | Admitting: Orthopedic Surgery

## 2012-08-11 DIAGNOSIS — M19019 Primary osteoarthritis, unspecified shoulder: Secondary | ICD-10-CM

## 2012-08-11 LAB — CBC
HCT: 33.8 % — ABNORMAL LOW (ref 39.0–52.0)
MCHC: 33.1 g/dL (ref 30.0–36.0)
MCV: 89.7 fL (ref 78.0–100.0)
RDW: 14.1 % (ref 11.5–15.5)

## 2012-08-11 LAB — BASIC METABOLIC PANEL
BUN: 13 mg/dL (ref 6–23)
Calcium: 8.3 mg/dL — ABNORMAL LOW (ref 8.4–10.5)
Creatinine, Ser: 0.78 mg/dL (ref 0.50–1.35)
GFR calc Af Amer: >90 mL/min (ref >90)
GFR calc non Af Amer: 86 mL/min — ABNORMAL LOW (ref >90)

## 2012-08-11 MED ORDER — OXYCODONE-ACETAMINOPHEN 5-325 MG PO TABS: 1.0000 | ORAL_TABLET | ORAL | Status: DC | PRN

## 2012-08-11 NOTE — Progress Notes (Signed)
Orthopedic Tech Progress Note Patient Details:  Jared Benjamin 06-19-1935 [id number] Sling immobilizer ordered to replace the one given in surgery because it did not have waist strap. Immobilizer applied to Left arm.  Ortho Devices Type of Ortho Device: Sling immobilizer Ortho Device/Splint Location: Left UE Ortho Device/Splint Interventions: Application   Asia R Thompson 08/11/2012, 11:03 AM

## 2012-08-11 NOTE — Progress Notes (Signed)
Occupational Therapy Treatment and Discharge Patient Details Name: Jared Benjamin MRN: [id number] DOB: Jul 05, 1935 Today's Date: 08/11/2012 Time: 0925-1000 OT Time Calculation (min): 35 min  OT Assessment / Plan / Recommendation Comments on Treatment Session Pt is ready for d/c.  Demonstrated and verbalized hemitechniques, home program, and restrictions.    Follow Up Recommendations  Supervision - Intermittent    Barriers to Discharge       Equipment Recommendations  None recommended by OT    Recommendations for Other Services    Frequency Min 2X/week   Plan Discharge plan remains appropriate    Precautions / Restrictions Precautions Precautions: Shoulder Type of Shoulder Precautions: NO ER, FF in supine to 130-140 only Precaution Booklet Issued: Yes (comment) Restrictions Weight Bearing Restrictions: Yes LUE Weight Bearing: Non weight bearing Other Position/Activity Restrictions: sling at all times except with bathing/dressing and exercises   Pertinent Vitals/Pain No pain    ADL  Upper Body Bathing: Performed;Set up Where Assessed - Upper Body Bathing: Unsupported sitting Upper Body Dressing: Performed;Supervision/safety Where Assessed - Upper Body Dressing: Unsupported sitting Toilet Transfer: Performed;Supervision/safety Toilet Transfer Method: Sit to stand Equipment Used: Other (comment) (shoulder sling/immobilizer) Transfers/Ambulation Related to ADLs: supervision to independent ADL Comments: Practiced hemitechniques for UE bathing and dressing. Call to [employer] for new shoulder sling, missing waist strap for immobilization    OT Diagnosis:    OT Problem List:   OT Treatment Interventions:     OT Goals ADL Goals Pt Will Perform Upper Body Bathing: with supervision;Standing at sink ADL Goal: Upper Body Bathing - Progress: Progressing toward goals Pt Will Perform Upper Body Dressing: with supervision;Sitting, bed ADL Goal: Upper Body Dressing - Progress:  Met Arm Goals Pt Will Perform AROM: 10 reps;to maintain range of motion;Independently Arm Goal: AROM - Progress: Met Additional Arm Goal #1: Pt will be independent in FF ROM exercises of L shoulder in supine x 10 reps. Arm Goal: Additional Goal #1 - Progress: Met Miscellaneous OT Goals Miscellaneous OT Goal #1: Pt and wife will be independent in L UE sling use and positioning in bed and chair. OT Goal: Miscellaneous Goal #1 - Progress: Met  Visit Information  Last OT Received On: 08/11/12 Assistance Needed: +1    Subjective Data      Prior Functioning       Cognition  Overall Cognitive Status: Appears within functional limits for tasks assessed/performed Arousal/Alertness: Awake/alert Orientation Level: Appears intact for tasks assessed Behavior During Session: [facility] for tasks performed    Mobility  Shoulder Instructions Bed Mobility Bed Mobility: Supine to Sit Supine to Sit: 7: Independent Transfers Transfers: Sit to Stand;Stand to Sit Sit to Stand: 7: Independent;From bed;From toilet Stand to Sit: 7: Independent;To chair/3-in-1;To toilet       Exercises      Balance     End of Session OT - End of Session Activity Tolerance: Patient tolerated treatment well Patient left: in chair;with family/visitor present;with call bell/phone within reach Nurse Communication: Other (comment) (agreed with need for new sling)  GO     Evern Bio 08/11/2012, 10:12 AM [phone]

## 2012-08-11 NOTE — Discharge Summary (Signed)
Patient ID: Jared Benjamin MRN: [id number] DOB/AGE: 1935/08/30 77 y.o.  Admit date: 08/09/2012 Discharge date: 08/11/2012  Admission Diagnoses:  Principal Problem:  *Shoulder arthritis   Discharge Diagnoses:  Same  Past Medical History  Diagnosis Date  . PONV (postoperative nausea and vomiting)   . Hypertension     dr c Sondra Come  . GERD (gastroesophageal reflux disease)     Surgeries: Procedure(s): TOTAL SHOULDER ARTHROPLASTY on 08/09/2012   Consultants:    Discharged Condition: Improved  Hospital Course: Jared Benjamin is an 77 y.o. male who was admitted 08/09/2012 for operative treatment ofShoulder arthritis. Patient has severe unremitting pain that affects sleep, daily activities, and work/hobbies. After pre-op clearance the patient was taken to the operating room on 08/09/2012 and underwent  Procedure(s): TOTAL SHOULDER ARTHROPLASTY.    Patient was given perioperative antibiotics: Anti-infectives     Start     Dose/Rate Route Frequency Ordered Stop   08/09/12 1400   ceFAZolin (ANCEF) IVPB 2 g/50 mL premix        2 g 100 mL/hr over 30 Minutes Intravenous Every 6 hours 08/09/12 1255 08/10/12 0230   08/08/12 1450   ceFAZolin (ANCEF) IVPB 2 g/50 mL premix        2 g 100 mL/hr over 30 Minutes Intravenous 60 min pre-op 08/08/12 1450 08/09/12 0748           Patient was given sequential compression devices, early ambulation, and ASA 325mg  BID to prevent DVT.  Patient benefited maximally from hospital stay and there were no complications.    Recent vital signs: Patient Vitals for the past 24 hrs:  BP Temp Temp src Pulse Resp SpO2  08/11/12 0649 141/65 mmHg 98.9 F (37.2 C) - 86  18  98 %  08/10/12 2345 132/65 mmHg 98.9 F (37.2 C) - 91  18  97 %  08/10/12 2130 150/73 mmHg 98.5 F (36.9 C) Oral 86  16  -  08/10/12 1410 140/64 mmHg 98 F (36.7 C) Oral 85  18  95 %  08/10/12 1100 127/58 mmHg 97.9 F (36.6 C) Oral 79  16  97 %     Recent laboratory studies:    [facility] 08/11/12 0655 08/10/12 0653  WBC 8.7 8.2  HGB 11.2* 10.0*  HCT 33.8* 31.0*  PLT 164 152  NA -- 140  K -- 3.2*  CL -- 111  CO2 -- 24  BUN -- 10  CREATININE -- 0.71  GLUCOSE -- 95  INR -- --  CALCIUM -- 7.0*     Discharge Medications:     Medication List     As of 08/11/2012  7:52 AM    TAKE these medications         aspirin EC 81 MG tablet   Take 81 mg by mouth daily.      benazepril 20 MG tablet   Commonly known as: LOTENSIN   Take 20 mg by mouth daily.      LUTEIN PO   Take 1 tablet by mouth daily.      oxyCODONE-acetaminophen 5-325 MG per tablet   Commonly known as: PERCOCET/ROXICET   Take 1-2 tablets by mouth every 4 (four) hours as needed for pain.      ranitidine 150 MG tablet   Commonly known as: ZANTAC   Take 150 mg by mouth daily as needed. For acid reflux      simvastatin 40 MG tablet   Commonly known as: ZOCOR   Take 40 mg  by mouth at bedtime.        Diagnostic Studies: Dg Chest 2 View  07/28/2012  *RADIOLOGY REPORT*  Clinical Data: Preop radiograph.  CHEST - 2 VIEW  Comparison: None  Findings: Moderate to marked arthropathic changes are noted involving the left glenohumeral joint.  Chronic appearing left posterior rib fracture deformities are identified.  Heart size appears normal.  No pleural effusion or edema.  Multilevel spondylosis identified within the thoracic spine.  IMPRESSION:  1.  No acute cardiopulmonary abnormalities. 2.  Chronic skeletal changes as above.   Original Report Authenticated By: Rosealee Albee, M.D.    Dg Shoulder 1v Left  08/09/2012  *RADIOLOGY REPORT*  Clinical Data: Post left shoulder arthroplasty  LEFT SHOULDER - 1 VIEW  Comparison: Chest radiograph - 07/28/2012  Findings: Post hemiarthroplasty of the left humerus.  No definite or dislocation fracture on this solitary AP provided projection.  A surgical drain overlies the superior lateral aspect of the operative site.  An additional surgical clip overlies the  glenoid process.  No radiopaque foreign body.  There is a minimal amount of expected intra-articular air.  Limited visualization of the adjacent thorax demonstrates multiple old/healed posterior lateral left-sided rib fractures.  IMPRESSION: Post left humeral hemiarthroplasty without definite complicating feature.   Original Report Authenticated By: Waynard Reeds, M.D.     Disposition: Final discharge disposition not confirmed      Discharge Orders    Future Orders Please Complete By Expires   Diet - low sodium heart healthy      Call MD / Call 911      Comments:   If you experience chest pain or shortness of breath, CALL 911 and be transported to the hospital emergency room.  If you develope a fever above 101 F, pus (white drainage) or increased drainage or redness at the wound, or calf pain, call your surgeon's office.   Constipation Prevention      Comments:   Drink plenty of fluids.  Prune juice may be helpful.  You may use a stool softener, such as Colace (over the counter) 100 mg twice a day.  Use MiraLax (over the counter) for constipation as needed.   Increase activity slowly as tolerated      Driving restrictions      Comments:   No driving until cleared by physician.   Lifting restrictions      Comments:   No lifting cleared by physician.      Follow-up Information    Follow up with Mable Paris, MD. Schedule an appointment as soon as possible for a visit in 10 days.   Contact information:   [facility] [address] [place] [address] [phone]           Signed: [name] 08/11/2012, 7:52 AM

## 2012-08-11 NOTE — Progress Notes (Signed)
PATIENT ID: Larose Kells   2 Days Post-Op Procedure(s) (LRB): TOTAL SHOULDER ARTHROPLASTY (Left)  Subjective: Doing well. Ready to go home, no complains or concerns. No dizziness, lightheadedness. Worked with OT and understands exercises.  Objective:  Filed Vitals:   08/11/12 0649  BP: 141/65  Pulse: 86  Temp: 98.9 F (37.2 C)  Resp: 18     Dressing removed, steri's intact. Wiggles fingers, intact sensation to light touch distally.  Labs:   [facility] 08/11/12 0655 08/10/12 0653  HGB 11.2* 10.0*   Basename 08/11/12 0655 08/10/12 0653  WBC 8.7 8.2  RBC 3.77* 3.45*  HCT 33.8* 31.0*  PLT 164 152   Basename 08/10/12 0653  NA 140  K 3.2*  CL 111  CO2 24  BUN 10  CREATININE 0.71  GLUCOSE 95  CALCIUM 7.0*    Assessment and Plan: 2 days s/p total shoulder arthroplasty, left Ready to go home Percocet for home pain control Fu with Dr Ave Filter in 10-14 days  VTE proph: SCDs, ASA 325mg  BID

## 2013-01-06 ENCOUNTER — Other Ambulatory Visit: Payer: Self-pay | Admitting: Gastroenterology

## 2013-01-09 ENCOUNTER — Encounter (HOSPITAL_COMMUNITY): Payer: Self-pay | Admitting: Pharmacy Technician

## 2013-01-09 NOTE — Progress Notes (Signed)
Spoke with Jared Benjamin this morning to obtain pre op history for Endoscopy procedure he plans to cancel his procedure until May.

## 2013-02-07 ENCOUNTER — Encounter (HOSPITAL_COMMUNITY): Payer: Self-pay | Admitting: *Deleted

## 2013-02-27 ENCOUNTER — Ambulatory Visit (HOSPITAL_COMMUNITY)
Admission: RE | Admit: 2013-02-27 | Discharge: 2013-02-27 | Disposition: A | Discharge status: Home / Self Care | Payer: Medicare Other | Source: Ambulatory Visit | Attending: Gastroenterology | Admitting: Gastroenterology

## 2013-02-27 ENCOUNTER — Encounter (HOSPITAL_COMMUNITY)
Admission: RE | Disposition: A | Discharge status: Home / Self Care | Payer: Self-pay | Source: Ambulatory Visit | Attending: Gastroenterology

## 2013-02-27 ENCOUNTER — Encounter (HOSPITAL_COMMUNITY): Payer: Self-pay | Admitting: Anesthesiology

## 2013-02-27 ENCOUNTER — Ambulatory Visit (HOSPITAL_COMMUNITY): Payer: Medicare Other | Admitting: Anesthesiology

## 2013-02-27 DIAGNOSIS — D126 Benign neoplasm of colon, unspecified: Secondary | ICD-10-CM | POA: Insufficient documentation

## 2013-02-27 DIAGNOSIS — Z860100 Personal history of colon polyps, unspecified: Secondary | ICD-10-CM | POA: Insufficient documentation

## 2013-02-27 DIAGNOSIS — I10 Essential (primary) hypertension: Secondary | ICD-10-CM | POA: Insufficient documentation

## 2013-02-27 DIAGNOSIS — D375 Neoplasm of uncertain behavior of rectum: Secondary | ICD-10-CM | POA: Insufficient documentation

## 2013-02-27 DIAGNOSIS — E7800 Pure hypercholesterolemia, unspecified: Secondary | ICD-10-CM | POA: Insufficient documentation

## 2013-02-27 DIAGNOSIS — D371 Neoplasm of uncertain behavior of stomach: Secondary | ICD-10-CM | POA: Insufficient documentation

## 2013-02-27 DIAGNOSIS — Z1211 Encounter for screening for malignant neoplasm of colon: Secondary | ICD-10-CM | POA: Insufficient documentation

## 2013-02-27 HISTORY — PX: COLONOSCOPY WITH PROPOFOL: SHX5780

## 2013-02-27 SURGERY — COLONOSCOPY WITH PROPOFOL
Anesthesia: Monitor Anesthesia Care

## 2013-02-27 MED ORDER — SODIUM CHLORIDE 0.9 % IV SOLN: INTRAVENOUS | Status: DC

## 2013-02-27 MED ORDER — PROPOFOL 10 MG/ML IV BOLUS
INTRAVENOUS | Status: DC | PRN
  Administered 2013-02-27 (×3): 20 mg via INTRAVENOUS

## 2013-02-27 MED ORDER — PROPOFOL 10 MG/ML IV EMUL
INTRAVENOUS | Status: DC | PRN
  Administered 2013-02-27: 160 ug/kg/min via INTRAVENOUS

## 2013-02-27 MED ORDER — KETAMINE HCL 10 MG/ML IJ SOLN
INTRAMUSCULAR | Status: DC | PRN
  Administered 2013-02-27 (×2): 10 mg via INTRAVENOUS

## 2013-02-27 MED ORDER — LACTATED RINGERS IV SOLN
INTRAVENOUS | Status: DC | PRN
  Administered 2013-02-27: 11:00:00 via INTRAVENOUS

## 2013-02-27 SURGICAL SUPPLY — 22 items

## 2013-02-27 NOTE — H&P (Signed)
Procedure: Surveillance colonoscopy.  History: The patient is a 78 year old male born 10/11/1935. The patient underwent a colonoscopic exam is in 2003 and 2006 with removal of adenomatous colon polyps. He underwent a normal surveillance colonoscopy in 2009.  The patient is scheduled to undergo a repeat surveillance colonoscopy.  Chronic medications: Benzapril. Aspirin. Ocuvite.  Past medical and surgical history: Hypercholesterolemia. Hypertension. Left arm fracture. Foot surgery. Left fibula and tibia surgery following a fracture. Right ankle fracture surgery.  Habits: The patient quit smoking cigarettes in 1979. He consumes alcohol in moderation.  Medication allergies: Lipitor causes muscle aches  Exam: The patient is alert and lying comfortably on the endoscopy stretcher. Sclera are nonicteric. Cardiac exam reveals a regular rhythm. Abdomen is soft, flat, and nontender to palpation in all quadrants. Lungs are clear to auscultation.  Plan: Proceed with surveillance colonoscopy.

## 2013-02-27 NOTE — Transfer of Care (Signed)
Immediate Anesthesia Transfer of Care Note  Patient: Jared Benjamin  Procedure(s) Performed: Procedure(s): COLONOSCOPY WITH PROPOFOL (N/A)  Patient Location: PACU  Anesthesia Type:MAC  Level of Consciousness: awake, alert , oriented and patient cooperative  Airway & Oxygen Therapy: Patient Spontanous Breathing and Patient connected to face mask oxygen  Post-op Assessment: Report given to PACU RN and Post -op Vital signs reviewed and stable  Post vital signs: Reviewed and stable  Complications: No apparent anesthesia complications

## 2013-02-27 NOTE — Anesthesia Postprocedure Evaluation (Signed)
Anesthesia Post Note  Patient: Jared Benjamin  Procedure(s) Performed: Procedure(s) (LRB): COLONOSCOPY WITH PROPOFOL (N/A)  Anesthesia type: MAC  Patient location: PACU  Post pain: Pain level controlled  Post assessment: Post-op Vital signs reviewed  Last Vitals: BP 126/74  Pulse 88  Temp(Src) 36.4 C (Oral)  Resp 10  SpO2 98%  Post vital signs: Reviewed  Level of consciousness: awake  Complications: No apparent anesthesia complications

## 2013-02-27 NOTE — Anesthesia Preprocedure Evaluation (Signed)
Anesthesia Evaluation  Patient identified by MRN, date of birth, ID band Patient awake    Reviewed: Allergy & Precautions, H&P , NPO status , Patient's Chart, lab work & pertinent test results  History of Anesthesia Complications (+) PONV  Airway Mallampati: II TM Distance: >3 FB Neck ROM: Full    Dental  (+) Teeth Intact and Dental Advisory Given   Pulmonary neg pulmonary ROS,  breath sounds clear to auscultation        Cardiovascular hypertension, Pt. on medications negative cardio ROS  Rhythm:Regular Rate:Normal     Neuro/Psych negative neurological ROS  negative psych ROS   GI/Hepatic negative GI ROS, Neg liver ROS, GERD-  Controlled,  Endo/Other  negative endocrine ROS  Renal/GU negative Renal ROS     Musculoskeletal negative musculoskeletal ROS (+)   Abdominal   Peds  Hematology negative hematology ROS (+)   Anesthesia Other Findings   Reproductive/Obstetrics                           Anesthesia Physical  Anesthesia Plan  ASA: II  Anesthesia Plan: MAC   Post-op Pain Management:    Induction: Intravenous  Airway Management Planned: Simple Face Mask  Additional Equipment:   Intra-op Plan:   Post-operative Plan:   Informed Consent: I have reviewed the patients History and Physical, chart, labs and discussed the procedure including the risks, benefits and alternatives for the proposed anesthesia with the patient or authorized representative who has indicated his/her understanding and acceptance.   Dental advisory given  Plan Discussed with: CRNA  Anesthesia Plan Comments:         Anesthesia Quick Evaluation

## 2013-02-27 NOTE — Op Note (Signed)
Procedure: Surveillance colonoscopy  Endoscopist: Danise Edge  Premedication: Propofol administered by anesthesia  Procedure: the patient was placed in the left lateral decubitus position. Anal inspection and digital rectal exam were normal. The Pentax pediatric colonoscope was introduced into the rectum and advanced to the cecum. A normal-appearing appendiceal orifice and ileocecal valve were identified. Colonic preparation for the exam today was good.  Rectum. Normal. Retroflex view of the distal rectum normal.  Sigmoid colon and descending colon. Normal.  Splenic flexure. Normal.  Transverse colon. Normal.  Hepatic flexure. Normal.  Ascending colon. From the proximal descending colon a 5 mm sessile polyp was removed with the cold snare. A 3 mm sessile polyp was removed with the cold biopsy forceps.  Cecum and ileocecal valve. Normal.  Assessment: From the proximal ascending colon, a 5 mm sessile polyp was removed with the cold snare and a 3 mm sessile polyp was removed with the cold biopsy forceps.  Recommendations: Schedule repeat surveillance colonoscopy in 5 years.

## 2013-02-28 ENCOUNTER — Encounter (HOSPITAL_COMMUNITY): Payer: Self-pay | Admitting: Gastroenterology

## 2013-09-21 ENCOUNTER — Ambulatory Visit (INDEPENDENT_AMBULATORY_CARE_PROVIDER_SITE_OTHER): Payer: Medicare Other | Admitting: Emergency Medicine

## 2013-09-21 VITALS — BP 146/82 | HR 93 | Temp 98.0°F | Resp 16 | Ht 72.5 in | Wt 186.4 lb

## 2013-09-21 DIAGNOSIS — J018 Other acute sinusitis: Secondary | ICD-10-CM

## 2013-09-21 DIAGNOSIS — J209 Acute bronchitis, unspecified: Secondary | ICD-10-CM

## 2013-09-21 MED ORDER — PSEUDOEPHEDRINE-GUAIFENESIN ER 60-600 MG PO TB12: 1.0000 | ORAL_TABLET | Freq: Two times a day (BID) | ORAL | Status: AC

## 2013-09-21 MED ORDER — HYDROCOD POLST-CHLORPHEN POLST 10-8 MG/5ML PO LQCR: 5.0000 mL | Freq: Two times a day (BID) | ORAL | Status: DC | PRN

## 2013-09-21 MED ORDER — AMOXICILLIN-POT CLAVULANATE 875-125 MG PO TABS: 1.0000 | ORAL_TABLET | Freq: Two times a day (BID) | ORAL | Status: DC

## 2013-09-21 NOTE — Patient Instructions (Signed)

## 2013-09-21 NOTE — Progress Notes (Signed)
[facility] [address], [place] [address] [phone]  Date:  09/21/2013   Name:  Jared Benjamin   DOB:  1935-07-20   MRN:  [id number]  PCP:   Duane Lope, MD    Chief Complaint: Cough, Nasal Congestion and Oral Pain   History of Present Illness:  Jared Benjamin is a 78 y.o. very pleasant male patient who presents with the following:  Ill with nasal congestion and drainage and sore throat.  Cough is not productive.  No fever or chills.  Has mucopurulent nasal drainage.  No nausea or vomiting.  No wheezing or shortness of breath.  No improvement with over the counter medications or other home remedies. Denies other complaint or health concern today.   Patient Active Problem List   Diagnosis Date Noted  . Shoulder arthritis 08/11/2012    Past Medical History  Diagnosis Date  . PONV (postoperative nausea and vomiting)   . Hypertension     dr c Sondra Come  . GERD (gastroesophageal reflux disease)   . Arthritis   . Cataract   . Thyroid disease     Past Surgical History  Procedure Laterality Date  . Bone spurs      off toes  . Fell      shoulder X 2 surgeried re. to fall  . Joint replacement      broke rt ankle--fall  . Fractured ribs      from AA  . Total shoulder arthroplasty  08/09/2012    Procedure: TOTAL SHOULDER ARTHROPLASTY;  Surgeon: Mable Paris, MD;  Location: [facility] OR;  Service: Orthopedics;  Laterality: Left;  . Colonscopy  2008  . Titanium implant Left 1 week ago    upper tooth extraction  . Colonoscopy with propofol N/A 02/27/2013    Procedure: COLONOSCOPY WITH PROPOFOL;  Surgeon: Charolett Bumpers, MD;  Location: [facility];  Service: Endoscopy;  Laterality: N/A;  . Fracture surgery      History  Substance Use Topics  . Smoking status: Former Smoker -- 1.00 packs/day for 22 years    Types: Cigarettes    Quit date: 10/26/1977  . Smokeless tobacco: Never Used  . Alcohol Use: Yes     Comment: weekly    Family  History  Problem Relation Age of Onset  . Heart disease Mother   . Hypertension Father   . Stroke Father   . Hypertension Sister     No Known Allergies  Medication list has been reviewed and updated.  Current Outpatient Prescriptions on File Prior to Visit  Medication Sig Dispense Refill  . aspirin EC 81 MG tablet Take 81 mg by mouth daily.      . benazepril (LOTENSIN) 20 MG tablet Take 20 mg by mouth daily before breakfast.       . ibuprofen (ADVIL,MOTRIN) 200 MG tablet Take 400 mg by mouth every 6 (six) hours as needed for pain.      . ranitidine (ZANTAC) 150 MG tablet Take 150 mg by mouth daily as needed. For acid reflux      . Red Yeast Rice 600 MG CAPS Take 1 capsule by mouth 2 (two) times daily.      . Misc Natural Products (LUTEIN 20 PO) Take 1 tablet by mouth daily.      . simvastatin (ZOCOR) 40 MG tablet Take 40 mg by mouth every evening.       No current facility-administered medications on file prior to visit.  Review of Systems:  As per HPI, otherwise negative.    Physical Examination: Filed Vitals:   09/21/13 0857  BP: 146/82  Pulse: 93  Temp: 98 F (36.7 C)  Resp: 16   Filed Vitals:   09/21/13 0857  Height: 6' 0.5" (1.842 m)  Weight: 186 lb 6.4 oz (84.55 kg)   Body mass index is 24.92 kg/(m^2). Ideal Body Weight: Weight in (lb) to have BMI = 25: 186.5  GEN: WDWN, NAD, Non-toxic, A & O x 3 HEENT: Atraumatic, Normocephalic. Neck supple. No masses, No LAD. Ears and Nose: No external deformity. CV: RRR, No M/G/R. No JVD. No thrill. No extra heart sounds. PULM: CTA B, no wheezes, crackles, rhonchi. No retractions. No resp. distress. No accessory muscle use. ABD: S, NT, ND, +BS. No rebound. No HSM. EXTR: No c/c/e NEURO Normal gait.  PSYCH: Normally interactive. Conversant. Not depressed or anxious appearing.  Calm demeanor.    Assessment and Plan: Sinusitis Bronchitis augmentin muciniex tussionex STOP AFRIN  Signed,  Phillips Odor,  MD

## 2013-12-08 ENCOUNTER — Encounter: Payer: Self-pay | Admitting: *Deleted

## 2013-12-08 DIAGNOSIS — E785 Hyperlipidemia, unspecified: Secondary | ICD-10-CM | POA: Insufficient documentation

## 2013-12-08 DIAGNOSIS — I10 Essential (primary) hypertension: Secondary | ICD-10-CM | POA: Insufficient documentation

## 2015-06-21 ENCOUNTER — Encounter (HOSPITAL_COMMUNITY): Payer: Self-pay | Admitting: Emergency Medicine

## 2015-06-21 ENCOUNTER — Emergency Department (HOSPITAL_COMMUNITY): Payer: Medicare Other

## 2015-06-21 ENCOUNTER — Emergency Department (HOSPITAL_COMMUNITY)
Admission: EM | Admit: 2015-06-21 | Discharge: 2015-06-21 | Disposition: A | Discharge status: Home / Self Care | Payer: Medicare Other | Attending: Emergency Medicine | Admitting: Emergency Medicine

## 2015-06-21 ENCOUNTER — Encounter: Payer: Self-pay | Admitting: Internal Medicine

## 2015-06-21 DIAGNOSIS — I10 Essential (primary) hypertension: Secondary | ICD-10-CM | POA: Insufficient documentation

## 2015-06-21 DIAGNOSIS — Z8639 Personal history of other endocrine, nutritional and metabolic disease: Secondary | ICD-10-CM | POA: Diagnosis not present

## 2015-06-21 DIAGNOSIS — M1990 Unspecified osteoarthritis, unspecified site: Secondary | ICD-10-CM | POA: Diagnosis not present

## 2015-06-21 DIAGNOSIS — Z87891 Personal history of nicotine dependence: Secondary | ICD-10-CM | POA: Insufficient documentation

## 2015-06-21 DIAGNOSIS — Z8669 Personal history of other diseases of the nervous system and sense organs: Secondary | ICD-10-CM | POA: Insufficient documentation

## 2015-06-21 DIAGNOSIS — Z79899 Other long term (current) drug therapy: Secondary | ICD-10-CM | POA: Insufficient documentation

## 2015-06-21 DIAGNOSIS — R002 Palpitations: Secondary | ICD-10-CM | POA: Diagnosis present

## 2015-06-21 DIAGNOSIS — I480 Paroxysmal atrial fibrillation: Secondary | ICD-10-CM | POA: Diagnosis not present

## 2015-06-21 DIAGNOSIS — K219 Gastro-esophageal reflux disease without esophagitis: Secondary | ICD-10-CM | POA: Diagnosis not present

## 2015-06-21 LAB — COMPREHENSIVE METABOLIC PANEL
ALBUMIN: 3.8 g/dL (ref 3.5–5.0)
ALK PHOS: 53 U/L (ref 38–126)
ALT: 21 U/L (ref 17–63)
AST: 26 U/L (ref 15–41)
Anion gap: 8 (ref 5–15)
BILIRUBIN TOTAL: 0.5 mg/dL (ref 0.3–1.2)
BUN: 15 mg/dL (ref 6–20)
CALCIUM: 9.5 mg/dL (ref 8.9–10.3)
CO2: 25 mmol/L (ref 22–32)
CREATININE: 1.03 mg/dL (ref 0.61–1.24)
Chloride: 109 mmol/L (ref 101–111)
GFR calc non Af Amer: >60 mL/min (ref >60)
GLUCOSE: 110 mg/dL — AB (ref 65–99)
Potassium: 4.4 mmol/L (ref 3.5–5.1)
SODIUM: 142 mmol/L (ref 135–145)
TOTAL PROTEIN: 6.7 g/dL (ref 6.5–8.1)

## 2015-06-21 LAB — I-STAT TROPONIN, ED: Troponin i, poc: 0.01 ng/mL (ref 0.00–0.08)

## 2015-06-21 LAB — CBC
HEMATOCRIT: 43.7 % (ref 39.0–52.0)
Hemoglobin: 14.8 g/dL (ref 13.0–17.0)
MCH: 30.8 pg (ref 26.0–34.0)
MCHC: 33.9 g/dL (ref 30.0–36.0)
MCV: 90.9 fL (ref 78.0–100.0)
Platelets: 193 10*3/uL (ref 150–400)
RBC: 4.81 MIL/uL (ref 4.22–5.81)
RDW: 13.5 % (ref 11.5–15.5)
WBC: 5.9 10*3/uL (ref 4.0–10.5)

## 2015-06-21 LAB — TSH: TSH: 1.093 u[IU]/mL (ref 0.350–4.500)

## 2015-06-21 LAB — T4, FREE: Free T4: 0.84 ng/dL (ref 0.61–1.12)

## 2015-06-21 MED ORDER — XARELTO VTE STARTER PACK 15 & 20 MG PO TBPK: 15.0000 mg | ORAL_TABLET | ORAL | Status: DC

## 2015-06-21 NOTE — ED Notes (Signed)
Cardiology at bedside.

## 2015-06-21 NOTE — Consult Note (Signed)
Primary Physician:  Harrington Challenger Primary Cardiologist:  New  Asked to see re atrial fib HPI: Pt is a 80 yo who was referred to ER for atrial fib  Pt woke up this AM with fluttering in chest  Went to [place]  Found to be in atrial fib  120 to 150  EMS called  Brought to ER  Converted on way.   Pt reports waking up this morning About to get up an heart started racing  Denied dizziness  No CP  No SOB  Drove self to doctors office.  Heart slowed and he convwerted  Per report afib He says he has had 2 spells in past  Both lasted a couple hours  He says brought on by stretching.  Last spell 6 years ago  Active  [employer]  No CP  No SOB  No falls.   BP med recently changed due to dry cough   Past Medical History  Diagnosis Date  . PONV (postoperative nausea and vomiting)   . Hypertension     dr c Jonna Coup  . GERD (gastroesophageal reflux disease)   . Arthritis   . Cataract   . Thyroid disease      (Not in a hospital admission)     Infusions:   Allergies  Allergen Reactions  . Lipitor [Atorvastatin] Other (See Comments)    achey     Social History   Social History  . Marital Status: Married    Spouse Name: N/A  . Number of Children: N/A  . Years of Education: N/A   Occupational History  . Not on file.   Social History Main Topics  . Smoking status: Former Smoker -- 1.00 packs/day for 22 years    Types: Cigarettes    Quit date: 10/26/1977  . Smokeless tobacco: Never Used  . Alcohol Use: Yes     Comment: weekly  . Drug Use: No  . Sexual Activity: Not on file   Other Topics Concern  . Not on file   Social History Narrative    Family History  Problem Relation Age of Onset  . Heart disease Mother   . Hypertension Father   . Stroke Father   . Hypertension Sister     REVIEW OF SYSTEMS:  All systems reviewed  Negative to the above problem except as noted above.    PHYSICAL EXAM: Filed Vitals:   06/21/15 1015  BP: 129/79  Pulse: 77  Temp:   Resp: 21     No intake or output data in the 24 hours ending 06/21/15 1041  General:  Well appearing. No respiratory difficulty HEENT: normal Neck: supple. no JVD. Carotids 2+ bilat; no bruits. No lymphadenopathy or thryomegaly appreciated. Cor: PMI nondisplaced. Regular rate & rhythm. No rubs, gallops or murmurs. Lungs: clear Abdomen: soft, nontender, nondistended. No hepatosplenomegaly. No bruits or masses. Good bowel sounds. Extremities: no cyanosis, clubbing, rash, edema Neuro: alert & oriented x 3, cranial nerves grossly intact. moves all 4 extremities w/o difficulty. Affect pleasant.  ECG:  SR 84 bpm  Nonspecific ST T wave changes    Results for orders placed or performed during the hospital encounter of 06/21/15 (from the past 24 hour(s))  I-stat troponin, ED     Status: None   Collection Time: 06/21/15 10:20 AM  Result Value Ref Range   Troponin i, poc 0.01 0.00 - 0.08 ng/mL   Comment 3           No results  found.   ASSESSMENT:  80 yo with paroxysmal afib  Converted to SR  Exam unremarkable  EKG now SR CHADSVASc score of 3   I have reviewed with pt  Even though he says he knows exactly when in afib may indeed have "silent" spells  I would therefore recomm oral anticoagulant  Xarelto daily    Can have  Prn lopressor 25 for recurrent   Check TSH Sched echo  Tuesday Aug 30 [place] 7:30. F/U with Dr Martinique per pt's request  [facility] office  Appt with  Thurs 9.15.16 at 9:30 with Tenny Craw who is working that day with Dr Martinique.  I will confirm EKGs from Dr Harrington Challenger' office   2.  HTN  Follow on current regimen  3  HL  Contineu Red yeast rice  Will need to be followed

## 2015-06-21 NOTE — Discharge Instructions (Signed)

## 2015-06-21 NOTE — ED Notes (Addendum)
Received pt from [facility] with c/o when he woke up this morning (5:45am) and stretched he felt a flutter in his chest. Pt seen at [facility] and noted to be in afib with rate of 120-150. Pt converted into NSR with EMS. Pt does not have any other symptoms. Pt reports that he had a change in his medications on Tuesday. Wife uses Dr Martinique and pt and family request to see Dr Martinique. Pt given 324 MG of aspirin by EMS.
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
## 2015-06-24 ENCOUNTER — Other Ambulatory Visit: Payer: Self-pay | Admitting: Internal Medicine

## 2015-06-24 DIAGNOSIS — I4891 Unspecified atrial fibrillation: Secondary | ICD-10-CM

## 2015-06-25 ENCOUNTER — Other Ambulatory Visit: Payer: Self-pay

## 2015-06-25 ENCOUNTER — Telehealth: Payer: Self-pay | Admitting: *Deleted

## 2015-06-25 ENCOUNTER — Ambulatory Visit (HOSPITAL_COMMUNITY): Payer: Medicare Other | Attending: Cardiology

## 2015-06-25 DIAGNOSIS — I10 Essential (primary) hypertension: Secondary | ICD-10-CM | POA: Diagnosis not present

## 2015-06-25 DIAGNOSIS — I071 Rheumatic tricuspid insufficiency: Secondary | ICD-10-CM | POA: Diagnosis not present

## 2015-06-25 DIAGNOSIS — I4891 Unspecified atrial fibrillation: Secondary | ICD-10-CM | POA: Diagnosis not present

## 2015-06-25 NOTE — Telephone Encounter (Signed)
Per notes recorded by Dr. Harrington Challenger on ECHO result, called to Dr. Jossie Ng Ross's office [phone] to request EKGs from 8/26 where patient was noted to be in atrial fibrillation.  Office will fax EKGs for Dr. Harrington Challenger review/record.  Pt has appointment with PA at [facility] 9/15.  Received walk in note after ECHO completed today re: pt reporting pain in right calf that he has had since last thursday before he was seen in ER on 8/26.  Ibuprofen has not relieved the pain.  Pain is not noted at rest, just with walking. 9/10 pain with each step (previous week was at beach walking barefoot a lot)    Has been taking NOAC since Saturday.  Today is slightly better 6/10.   No redness, no swelling, temperature is normal.  No recent long distance travel other than 3.5 hr car ride back from beach.  Denies ever having a blood clot.  Advised patient of echo results and that I am forwarding his information to Dr. Harrington Challenger to review.  If there is any thing further I will call to inform him after hearing back from Dr. Harrington Challenger.  Pt verbalizes understanding and agreement.

## 2015-06-26 NOTE — Telephone Encounter (Signed)
EKG reviewed  It was atrial fibrillation. No new recommm from heart standpoint Regarding leg discomfort.  Discuss with Dr Gus Height Watch use of NSAID>

## 2015-06-27 NOTE — Telephone Encounter (Signed)
Pt. Is aware that  Dr. Harrington Challenger reviewed the EKG and it was A-Fib and that she does not have any recommendations for now. Pt to discuss the leg discomfort with Dr. Gus Height. Pt to watch the use of NSAID. Pt states that his legs feel a lot better, and that he appreciate all her help.

## 2015-07-11 ENCOUNTER — Encounter: Payer: Self-pay | Admitting: Physician Assistant

## 2015-07-11 ENCOUNTER — Ambulatory Visit (INDEPENDENT_AMBULATORY_CARE_PROVIDER_SITE_OTHER): Payer: Medicare Other | Admitting: Physician Assistant

## 2015-07-11 VITALS — BP 140/62 | HR 84 | Ht 71.0 in | Wt 192.2 lb

## 2015-07-11 DIAGNOSIS — I480 Paroxysmal atrial fibrillation: Secondary | ICD-10-CM | POA: Diagnosis not present

## 2015-07-11 DIAGNOSIS — I4891 Unspecified atrial fibrillation: Secondary | ICD-10-CM | POA: Diagnosis not present

## 2015-07-11 MED ORDER — METOPROLOL TARTRATE 25 MG PO TABS: 25.0000 mg | ORAL_TABLET | ORAL | Status: DC | PRN

## 2015-07-11 MED ORDER — METOPROLOL TARTRATE 25 MG PO TABS: 25.0000 mg | ORAL_TABLET | Freq: Two times a day (BID) | ORAL | Status: DC

## 2015-07-11 NOTE — Addendum Note (Signed)
Addended by: Janett Labella A on: 07/11/2015 01:21 PM   Modules accepted: Orders
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
## 2015-07-11 NOTE — Patient Instructions (Signed)
Medication Instructions:   A prescription for Metoprolol Tart. 25mg  to be taken as needed for Atrial Fibrillation.    Follow-Up:  3 months with Dr. Martinique

## 2015-07-15 ENCOUNTER — Telehealth: Payer: Self-pay

## 2015-07-15 NOTE — Telephone Encounter (Signed)
Patient called I received a message you saw Jared Fuller PA 07/11/15 and needed a follow up appointment in 3 months with Dr.Jordan.Advised schedule is already full.Advised to call back in 10/16 to schedule 10/2015 appointment.Advised to call sooner if needed.

## 2015-11-12 ENCOUNTER — Encounter: Payer: Self-pay | Admitting: Cardiology

## 2015-11-12 ENCOUNTER — Ambulatory Visit (INDEPENDENT_AMBULATORY_CARE_PROVIDER_SITE_OTHER): Payer: Medicare Other | Admitting: Cardiology

## 2015-11-12 VITALS — BP 120/78 | HR 68 | Ht 71.0 in | Wt 187.0 lb

## 2015-11-12 DIAGNOSIS — I10 Essential (primary) hypertension: Secondary | ICD-10-CM | POA: Diagnosis not present

## 2015-11-12 DIAGNOSIS — I480 Paroxysmal atrial fibrillation: Secondary | ICD-10-CM

## 2015-11-12 MED ORDER — APIXABAN 5 MG PO TABS: 5.0000 mg | ORAL_TABLET | Freq: Two times a day (BID) | ORAL | Status: DC

## 2015-11-12 MED ORDER — ASPIRIN EC 81 MG PO TBEC: 81.0000 mg | DELAYED_RELEASE_TABLET | Freq: Every day | ORAL | Status: DC
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
## 2015-11-12 NOTE — Patient Instructions (Signed)
We will start you on Eliquis 5 mg twice a day  Do not take ASA or nonsteroidal anti-inflammatory agents like Advil, ibuprofen, Aleve-- use Tylenol instead  I will see you in 6 months.

## 2015-12-16 ENCOUNTER — Telehealth: Payer: Self-pay

## 2015-12-16 NOTE — Telephone Encounter (Signed)
[facility] called 12/13/15.Prior Authorization obtained for Eliquis,approved until 10/25/16.

## 2016-06-25 ENCOUNTER — Other Ambulatory Visit: Payer: Self-pay | Admitting: Cardiology

## 2016-07-08 ENCOUNTER — Encounter: Payer: Self-pay | Admitting: Cardiology
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
## 2016-07-23 ENCOUNTER — Ambulatory Visit (INDEPENDENT_AMBULATORY_CARE_PROVIDER_SITE_OTHER): Payer: Medicare Other | Admitting: Cardiology

## 2016-07-23 ENCOUNTER — Encounter: Payer: Self-pay | Admitting: Cardiology

## 2016-07-23 VITALS — BP 130/76 | HR 74 | Ht 71.0 in | Wt 193.8 lb

## 2016-07-23 DIAGNOSIS — I10 Essential (primary) hypertension: Secondary | ICD-10-CM | POA: Diagnosis not present

## 2016-07-23 DIAGNOSIS — E785 Hyperlipidemia, unspecified: Secondary | ICD-10-CM

## 2016-07-23 DIAGNOSIS — I480 Paroxysmal atrial fibrillation: Secondary | ICD-10-CM

## 2016-07-23 MED ORDER — APIXABAN 5 MG PO TABS: 5.0000 mg | ORAL_TABLET | Freq: Two times a day (BID) | ORAL | 3 refills | Status: DC

## 2016-07-23 NOTE — Patient Instructions (Signed)
Continue your current therapy  I will see you in one year   
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
## 2017-07-23 ENCOUNTER — Encounter: Payer: Self-pay | Admitting: Cardiology

## 2017-07-23 ENCOUNTER — Ambulatory Visit (INDEPENDENT_AMBULATORY_CARE_PROVIDER_SITE_OTHER): Payer: Medicare Other | Admitting: Cardiology

## 2017-07-23 VITALS — BP 148/80 | HR 65 | Ht 71.5 in | Wt 191.4 lb

## 2017-07-23 DIAGNOSIS — I480 Paroxysmal atrial fibrillation: Secondary | ICD-10-CM | POA: Diagnosis not present

## 2017-07-23 NOTE — Patient Instructions (Signed)
Continue your current therapy  I will see you in one year   

## 2017-08-16 ENCOUNTER — Other Ambulatory Visit: Payer: Self-pay | Admitting: Cardiology

## 2017-08-16 DIAGNOSIS — I480 Paroxysmal atrial fibrillation: Secondary | ICD-10-CM

## 2017-12-06 ENCOUNTER — Other Ambulatory Visit: Payer: Self-pay | Admitting: Family Medicine

## 2017-12-06 DIAGNOSIS — I10 Essential (primary) hypertension: Secondary | ICD-10-CM

## 2017-12-16 ENCOUNTER — Ambulatory Visit
Admission: RE | Admit: 2017-12-16 | Discharge: 2017-12-16 | Disposition: A | Discharge status: Home / Self Care | Payer: Medicare Other | Source: Ambulatory Visit | Attending: Family Medicine | Admitting: Family Medicine

## 2017-12-16 DIAGNOSIS — I10 Essential (primary) hypertension: Secondary | ICD-10-CM

## 2018-02-14 ENCOUNTER — Other Ambulatory Visit: Payer: Self-pay | Admitting: Cardiology

## 2018-02-14 DIAGNOSIS — I480 Paroxysmal atrial fibrillation: Secondary | ICD-10-CM

## 2018-02-14 NOTE — Telephone Encounter (Signed)
Recent Blood work per [facility]  Scr = 1.18 on 06/29/17

## 2018-03-26 ENCOUNTER — Other Ambulatory Visit: Payer: Self-pay

## 2018-03-26 ENCOUNTER — Emergency Department (HOSPITAL_COMMUNITY)
Admission: EM | Admit: 2018-03-26 | Discharge: 2018-03-26 | Disposition: A | Discharge status: Home / Self Care | Payer: Medicare Other | Attending: Emergency Medicine | Admitting: Emergency Medicine

## 2018-03-26 DIAGNOSIS — Z87891 Personal history of nicotine dependence: Secondary | ICD-10-CM | POA: Insufficient documentation

## 2018-03-26 DIAGNOSIS — Z7901 Long term (current) use of anticoagulants: Secondary | ICD-10-CM | POA: Diagnosis not present

## 2018-03-26 DIAGNOSIS — Z79899 Other long term (current) drug therapy: Secondary | ICD-10-CM | POA: Diagnosis not present

## 2018-03-26 DIAGNOSIS — R197 Diarrhea, unspecified: Secondary | ICD-10-CM | POA: Insufficient documentation

## 2018-03-26 DIAGNOSIS — I10 Essential (primary) hypertension: Secondary | ICD-10-CM | POA: Diagnosis not present

## 2018-03-26 DIAGNOSIS — I4891 Unspecified atrial fibrillation: Secondary | ICD-10-CM | POA: Insufficient documentation

## 2018-03-26 DIAGNOSIS — R112 Nausea with vomiting, unspecified: Secondary | ICD-10-CM

## 2018-03-26 DIAGNOSIS — R42 Dizziness and giddiness: Secondary | ICD-10-CM | POA: Insufficient documentation

## 2018-03-26 LAB — URINALYSIS, ROUTINE W REFLEX MICROSCOPIC
BILIRUBIN URINE: NEGATIVE
Glucose, UA: NEGATIVE mg/dL
Hgb urine dipstick: NEGATIVE
KETONES UR: NEGATIVE mg/dL
Leukocytes, UA: NEGATIVE
NITRITE: NEGATIVE
Protein, ur: NEGATIVE mg/dL
Specific Gravity, Urine: 1.017 (ref 1.005–1.030)
pH: 6 (ref 5.0–8.0)

## 2018-03-26 LAB — COMPREHENSIVE METABOLIC PANEL
ALT: 21 U/L (ref 17–63)
ANION GAP: 6 (ref 5–15)
AST: 25 U/L (ref 15–41)
Albumin: 4 g/dL (ref 3.5–5.0)
Alkaline Phosphatase: 50 U/L (ref 38–126)
BILIRUBIN TOTAL: 0.4 mg/dL (ref 0.3–1.2)
BUN: 21 mg/dL — ABNORMAL HIGH (ref 6–20)
CO2: 25 mmol/L (ref 22–32)
Calcium: 9 mg/dL (ref 8.9–10.3)
Chloride: 108 mmol/L (ref 101–111)
Creatinine, Ser: 1.05 mg/dL (ref 0.61–1.24)
GFR calc Af Amer: >60 mL/min (ref >60)
GFR calc non Af Amer: >60 mL/min (ref >60)
GLUCOSE: 150 mg/dL — AB (ref 65–99)
POTASSIUM: 4.4 mmol/L (ref 3.5–5.1)
SODIUM: 139 mmol/L (ref 135–145)
TOTAL PROTEIN: 6.3 g/dL — AB (ref 6.5–8.1)

## 2018-03-26 LAB — CBC WITH DIFFERENTIAL/PLATELET
BASOS ABS: 0 10*3/uL (ref 0.0–0.1)
BASOS PCT: 0 %
EOS PCT: 1 %
Eosinophils Absolute: 0 10*3/uL (ref 0.0–0.7)
HCT: 43.6 % (ref 39.0–52.0)
Hemoglobin: 14.9 g/dL (ref 13.0–17.0)
Lymphocytes Relative: 8 %
Lymphs Abs: 0.7 10*3/uL (ref 0.7–4.0)
MCH: 31.2 pg (ref 26.0–34.0)
MCHC: 34.2 g/dL (ref 30.0–36.0)
MCV: 91.2 fL (ref 78.0–100.0)
MONO ABS: 0.5 10*3/uL (ref 0.1–1.0)
Monocytes Relative: 6 %
Neutro Abs: 7 10*3/uL (ref 1.7–7.7)
Neutrophils Relative %: 85 %
PLATELETS: 217 10*3/uL (ref 150–400)
RBC: 4.78 MIL/uL (ref 4.22–5.81)
RDW: 13.3 % (ref 11.5–15.5)
WBC: 8.2 10*3/uL (ref 4.0–10.5)

## 2018-03-26 LAB — LIPASE, BLOOD: LIPASE: 41 U/L (ref 11–51)

## 2018-03-26 MED ORDER — ONDANSETRON 8 MG PO TBDP
8.0000 mg | ORAL_TABLET | Freq: Once | ORAL | Status: AC
  Administered 2018-03-26: 8 mg via ORAL
  Filled 2018-03-26: qty 1

## 2018-03-26 MED ORDER — SODIUM CHLORIDE 0.9 % IV BOLUS
1000.0000 mL | Freq: Once | INTRAVENOUS | Status: AC
  Administered 2018-03-26: 1000 mL via INTRAVENOUS

## 2018-03-26 MED ORDER — ONDANSETRON 4 MG PO TBDP: 4.0000 mg | ORAL_TABLET | Freq: Three times a day (TID) | ORAL | 0 refills | Status: DC | PRN

## 2018-03-26 NOTE — Discharge Instructions (Signed)
Take Zofran as needed for nausea/vomiting Drink plenty of fluids Return if worsening

## 2018-03-26 NOTE — ED Provider Notes (Signed)
[facility] DEPT Provider Note   CSN: [id number] Arrival date & time: 03/26/18  1035     History   Chief Complaint Chief Complaint  Patient presents with  . N/V/D  . Dizziness    HPI Antavion Bartoszek is a 83 y.o. male who presents with dizziness, N/V/D. PMH significant for PAF on Eliquis, HTN, HLD, GERD. He states he got up this morning to go to the bathroom at [facility] and went back to bed. At about 7AM he got up again and felt very off balance/dizzy. He started to have multiple episodes of non-bloody emesis and dry heaves. He then had ~ 6 episodes of non-bloody diarrhea. He was unable to control his symptoms therefore EMS was called. He denies sick contacts, travel, recent antibiotics. He went to a friends house with his wife and no one else has had similar symptoms. He denies fever, chills, chest pain, SOB, abdominal pain, urinary symptoms. His wife also notes that the patient has been doing a low carb diet for the past week and has lost 6 pounds and she is concerned about his electrolytes. He denies prior abdominal surgeries.  HPI  Past Medical History:  Diagnosis Date  . Arthritis   . Atrial fibrillation, transient ([place])   . Cataract   . GERD (gastroesophageal reflux disease)   . Hypertension    dr c Jonna Coup  . PONV (postoperative nausea and vomiting)   . Thyroid disease     Patient Active Problem List   Diagnosis Date Noted  . Paroxysmal atrial fibrillation ([place]) 07/11/2015  . Hyperlipidemia 12/08/2013  . Essential hypertension 12/08/2013  . Shoulder arthritis 08/11/2012    Past Surgical History:  Procedure Laterality Date  . bone spurs     off toes  . COLONOSCOPY WITH PROPOFOL N/A 02/27/2013   Procedure: COLONOSCOPY WITH PROPOFOL;  Surgeon: Garlan Fair, MD;  Location: [facility] END[facility]COPY;  Service: Endoscopy;  Laterality: N/A;  . colonscopy  2008  . fell     shoulder X 2 surgeried re. to fall  . FRACTURE SURGERY    . fractured ribs     from AA  . JOINT REPLACEMENT     broke rt ankle--fall  . titanium implant Left 1 week ago   upper tooth extraction  . TOTAL SHOULDER ARTHROPLASTY  08/09/2012   Procedure: TOTAL SHOULDER ARTHROPLASTY;  Surgeon: Nita Sells, MD;  Location: [facility];  Service: Orthopedics;  Laterality: Left;        Home Medications    Prior to Admission medications   Medication Sig Start Date End Date Taking? Authorizing Provider  ELIQUIS 5 MG TABS tablet TAKE 1 TABLET BY MOUTH TWICE DAILY. 02/14/18   Martinique, Peter M, MD  irbesartan (AVAPRO) 150 MG tablet Take 150 mg by mouth daily. 06/18/15   [provider]  ranitidine (ZANTAC) 150 MG tablet Take 150 mg by mouth daily as needed. For acid reflux    [provider]  Red Yeast Rice 600 MG CAPS Take 1 capsule by mouth 2 (two) times daily.    [provider]    Family History Family History  Problem Relation Age of Onset  . Heart disease Mother   . Hypertension Father   . Stroke Father   . Hypertension Sister     Social History Social History   Tobacco Use  . Smoking status: Former Smoker    Packs/day: 1.00    Years: 22.00    Pack years: 22.00  Types: Cigarettes    Last attempt to quit: 10/26/1977    Years since quitting: 40.4  . Smokeless tobacco: Never Used  Substance Use Topics  . Alcohol use: Yes    Comment: weekly  . Drug use: No     Allergies   Lipitor [atorvastatin]   Review of Systems Review of Systems  Constitutional: Negative for chills and fever.  Respiratory: Negative for shortness of breath.   Cardiovascular: Negative for chest pain.  Gastrointestinal: Positive for diarrhea, nausea and vomiting. Negative for abdominal pain and blood in stool.  Genitourinary: Negative for dysuria.  Neurological: Positive for dizziness.  All other systems reviewed and are negative.    Physical Exam Updated Vital Signs BP (!) 168/74 (BP Location: Left Arm)   Pulse 70   Temp (!) 96.9 F (36.1  C) (Axillary)   Resp 18   Ht 5\' 11"  (1.803 m)   Wt 83.9 kg (185 lb)   SpO2 97%   BMI 25.80 kg/m   Physical Exam  Constitutional: He is oriented to person, place, and time. He appears well-developed and well-nourished. No distress.  Calm and cooperative. Pleasant  HENT:  Head: Normocephalic and atraumatic.  Eyes: Pupils are equal, round, and reactive to light. Conjunctivae are normal. Right eye exhibits no discharge. Left eye exhibits no discharge. No scleral icterus.  Neck: Normal range of motion.  Cardiovascular: Normal rate and regular rhythm.  Pulmonary/Chest: Effort normal and breath sounds normal. No respiratory distress.  Abdominal: Soft. Bowel sounds are normal. He exhibits no distension. There is no tenderness.  Neurological: He is alert and oriented to person, place, and time.  Skin: Skin is warm and dry.  Psychiatric: He has a normal mood and affect. His behavior is normal.  Nursing note and vitals reviewed.    ED Treatments / Results  Labs (all labs ordered are listed, but only abnormal results are displayed) Labs Reviewed  COMPREHENSIVE METABOLIC PANEL - Abnormal; Notable for the following components:      Result Value   Glucose, Bld 150 (*)    BUN 21 (*)    Total Protein 6.3 (*)    All other components within normal limits  CBC WITH DIFFERENTIAL/PLATELET  LIPASE, BLOOD  URINALYSIS, ROUTINE W REFLEX MICROSCOPIC    EKG None  Radiology No results found.  Procedures Procedures (including critical care time)  Medications Ordered in ED Medications  ondansetron (ZOFRAN-ODT) disintegrating tablet 8 mg (8 mg Oral Given 03/26/18 1119)  sodium chloride 0.9 % bolus 1,000 mL (0 mLs Intravenous Stopped 03/26/18 1305)     Initial Impression / Assessment and Plan / ED Course  I have reviewed the triage vital signs and the nursing notes.  Pertinent labs & imaging results that were available during my care of the patient were reviewed by me and considered in my  medical decision making (see chart for details).  83 year old male presents with acute onset of N/V/D and dizziness. He is hypertensive but otherwise vitals are normal. He denies any abdominal pain and his abdomen is soft and non-tender. He feels much better after Zofran which was given prior to my evaluation. Will obtain labs, EKG, give fluids, and check orthostatics. Shared visit with Dr. Vanita Panda.  Orthostatic VS for the past 24 hrs:  BP- Lying Pulse- Lying BP- Sitting Pulse- Sitting BP- Standing at 0 minutes Pulse- Standing at 0 minutes  03/26/18 1213 169/82 80 165/75 81 164/80 89    Orthostatics are negative. Labs are overall unremarkable. UA  is normal. He is feeling much better. Will d/c with zofran and was given return precautions.   Final Clinical Impressions(s) / ED Diagnoses   Final diagnoses:  Nausea vomiting and diarrhea    ED Discharge Orders    None       Recardo Evangelist, PA-C 03/26/18 1603    Carmin Muskrat, MD 03/26/18 1622

## 2018-03-26 NOTE — ED Notes (Signed)
Light green hemolyzed x2, NT to try blood draw.

## 2018-03-26 NOTE — ED Triage Notes (Signed)
Per EMS, patient comes from home. Woke up at 5am with diarrhea, then up at 7am had N/V and dizziness. Vomiting bile only. No abdominal pain. Denies diet changes. No pain, did not take his medications this morning or eat. Alert and oriented. Wife is on the way. Hx of afib. CBG 129

## 2018-03-26 NOTE — ED Notes (Signed)
Pt unable to do orthostatics at this time d/t dry heaves. PA aware. Zofran given.

## 2018-05-17 ENCOUNTER — Other Ambulatory Visit: Payer: Self-pay | Admitting: Cardiology

## 2018-05-17 DIAGNOSIS — I480 Paroxysmal atrial fibrillation: Secondary | ICD-10-CM

## 2018-11-15 ENCOUNTER — Other Ambulatory Visit: Payer: Self-pay | Admitting: Cardiology

## 2018-11-15 DIAGNOSIS — I480 Paroxysmal atrial fibrillation: Secondary | ICD-10-CM

## 2018-12-19 ENCOUNTER — Other Ambulatory Visit: Payer: Self-pay | Admitting: Cardiology

## 2018-12-19 DIAGNOSIS — I480 Paroxysmal atrial fibrillation: Secondary | ICD-10-CM

## 2019-01-06 ENCOUNTER — Encounter: Payer: Self-pay | Admitting: Cardiology

## 2019-01-13 ENCOUNTER — Telehealth: Payer: Self-pay

## 2019-01-13 DIAGNOSIS — I480 Paroxysmal atrial fibrillation: Secondary | ICD-10-CM

## 2019-01-13 MED ORDER — APIXABAN 5 MG PO TABS: 5.0000 mg | ORAL_TABLET | Freq: Two times a day (BID) | ORAL | 6 refills | Status: DC

## 2019-01-13 NOTE — Telephone Encounter (Signed)
   Primary Cardiologist:  Dr.Jordan   Patient contacted.  History reviewed.  No symptoms to suggest any unstable cardiac conditions.  Based on discussion, with current pandemic situation, we will be postponing this appointment 01/17/19 with Dr.Jordan.  If symptoms change, he has been instructed to contact our office.   Appointment rescheduled to 04/05/19 at 8:20 am.  Kathyrn Lass, LPN  5/63/1497 0:26 PM         .

## 2019-01-17 ENCOUNTER — Ambulatory Visit: Payer: Medicare Other | Admitting: Cardiology

## 2019-03-23 ENCOUNTER — Telehealth: Payer: Self-pay | Admitting: Cardiology

## 2019-03-23 NOTE — Telephone Encounter (Signed)
LVM for patient to call and let us know if he wants a phone or video visit.

## 2019-03-30 ENCOUNTER — Telehealth: Payer: Self-pay | Admitting: Cardiology

## 2019-03-30 NOTE — Telephone Encounter (Signed)
Tele-visit/ smartphone/ my chart via emailed/ consent/ pre reg completed
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
## 2019-04-05 ENCOUNTER — Encounter: Payer: Self-pay | Admitting: Cardiology

## 2019-04-05 ENCOUNTER — Telehealth (INDEPENDENT_AMBULATORY_CARE_PROVIDER_SITE_OTHER): Payer: Medicare Other | Admitting: Cardiology

## 2019-04-05 VITALS — BP 134/70 | HR 68 | Ht 71.5 in | Wt 186.0 lb

## 2019-04-05 DIAGNOSIS — I10 Essential (primary) hypertension: Secondary | ICD-10-CM

## 2019-04-05 DIAGNOSIS — I480 Paroxysmal atrial fibrillation: Secondary | ICD-10-CM

## 2019-04-05 MED ORDER — APIXABAN 5 MG PO TABS: 5.0000 mg | ORAL_TABLET | Freq: Two times a day (BID) | ORAL | 3 refills | Status: DC

## 2019-04-05 NOTE — Patient Instructions (Signed)
Medication Instructions:  Continue same medications If you need a refill on your cardiac medications before your next appointment, please call your pharmacy.   Lab work: None ordered   Testing/Procedures: None ordered   Follow-Up: At [facility], you and your health needs are our priority.  As part of our continuing mission to provide you with exceptional heart care, we have created designated Provider Care Teams.  These Care Teams include your primary Cardiologist (physician) and Advanced Practice Providers (APPs -  Physician Assistants and Nurse Practitioners) who all work together to provide you with the care you need, when you need it. . Schedule follow up appointment in 1 year    Call 3 months before to scheule

## 2019-11-03 ENCOUNTER — Encounter: Payer: Self-pay | Admitting: Pharmacist

## 2020-03-05 DIAGNOSIS — M1711 Unilateral primary osteoarthritis, right knee: Secondary | ICD-10-CM | POA: Diagnosis not present

## 2020-03-05 DIAGNOSIS — M25561 Pain in right knee: Secondary | ICD-10-CM | POA: Diagnosis not present

## 2020-03-11 DIAGNOSIS — M25661 Stiffness of right knee, not elsewhere classified: Secondary | ICD-10-CM | POA: Diagnosis not present

## 2020-03-11 DIAGNOSIS — M6281 Muscle weakness (generalized): Secondary | ICD-10-CM | POA: Diagnosis not present

## 2020-03-11 DIAGNOSIS — M1711 Unilateral primary osteoarthritis, right knee: Secondary | ICD-10-CM | POA: Diagnosis not present

## 2020-03-12 DIAGNOSIS — M1711 Unilateral primary osteoarthritis, right knee: Secondary | ICD-10-CM | POA: Diagnosis not present

## 2020-03-12 DIAGNOSIS — M25661 Stiffness of right knee, not elsewhere classified: Secondary | ICD-10-CM | POA: Diagnosis not present

## 2020-03-12 DIAGNOSIS — M6281 Muscle weakness (generalized): Secondary | ICD-10-CM | POA: Diagnosis not present

## 2020-03-19 DIAGNOSIS — M6281 Muscle weakness (generalized): Secondary | ICD-10-CM | POA: Diagnosis not present

## 2020-03-19 DIAGNOSIS — M1711 Unilateral primary osteoarthritis, right knee: Secondary | ICD-10-CM | POA: Diagnosis not present

## 2020-03-19 DIAGNOSIS — M25661 Stiffness of right knee, not elsewhere classified: Secondary | ICD-10-CM | POA: Diagnosis not present

## 2020-03-21 DIAGNOSIS — M25661 Stiffness of right knee, not elsewhere classified: Secondary | ICD-10-CM | POA: Diagnosis not present

## 2020-03-21 DIAGNOSIS — M6281 Muscle weakness (generalized): Secondary | ICD-10-CM | POA: Diagnosis not present

## 2020-03-21 DIAGNOSIS — M1711 Unilateral primary osteoarthritis, right knee: Secondary | ICD-10-CM | POA: Diagnosis not present

## 2020-03-27 DIAGNOSIS — M25661 Stiffness of right knee, not elsewhere classified: Secondary | ICD-10-CM | POA: Diagnosis not present

## 2020-03-27 DIAGNOSIS — M6281 Muscle weakness (generalized): Secondary | ICD-10-CM | POA: Diagnosis not present

## 2020-03-27 DIAGNOSIS — M1711 Unilateral primary osteoarthritis, right knee: Secondary | ICD-10-CM | POA: Diagnosis not present

## 2020-03-29 DIAGNOSIS — M25661 Stiffness of right knee, not elsewhere classified: Secondary | ICD-10-CM | POA: Diagnosis not present

## 2020-03-29 DIAGNOSIS — M1711 Unilateral primary osteoarthritis, right knee: Secondary | ICD-10-CM | POA: Diagnosis not present

## 2020-03-29 DIAGNOSIS — M6281 Muscle weakness (generalized): Secondary | ICD-10-CM | POA: Diagnosis not present

## 2020-04-02 DIAGNOSIS — M25661 Stiffness of right knee, not elsewhere classified: Secondary | ICD-10-CM | POA: Diagnosis not present

## 2020-04-02 DIAGNOSIS — M6281 Muscle weakness (generalized): Secondary | ICD-10-CM | POA: Diagnosis not present

## 2020-04-02 DIAGNOSIS — M1711 Unilateral primary osteoarthritis, right knee: Secondary | ICD-10-CM | POA: Diagnosis not present

## 2020-04-09 DIAGNOSIS — M6281 Muscle weakness (generalized): Secondary | ICD-10-CM | POA: Diagnosis not present

## 2020-04-09 DIAGNOSIS — M25661 Stiffness of right knee, not elsewhere classified: Secondary | ICD-10-CM | POA: Diagnosis not present

## 2020-04-09 DIAGNOSIS — M1711 Unilateral primary osteoarthritis, right knee: Secondary | ICD-10-CM | POA: Diagnosis not present

## 2020-05-01 DIAGNOSIS — M25561 Pain in right knee: Secondary | ICD-10-CM | POA: Diagnosis not present

## 2020-05-01 DIAGNOSIS — M1711 Unilateral primary osteoarthritis, right knee: Secondary | ICD-10-CM | POA: Diagnosis not present

## 2020-05-01 DIAGNOSIS — M25461 Effusion, right knee: Secondary | ICD-10-CM | POA: Diagnosis not present
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
## 2020-05-08 ENCOUNTER — Encounter: Payer: Self-pay | Admitting: Cardiology

## 2020-05-08 ENCOUNTER — Other Ambulatory Visit: Payer: Self-pay

## 2020-05-08 ENCOUNTER — Ambulatory Visit: Payer: Medicare PPO | Admitting: Cardiology

## 2020-05-08 VITALS — BP 140/72 | HR 71 | Ht 71.0 in | Wt 188.0 lb

## 2020-05-08 DIAGNOSIS — I10 Essential (primary) hypertension: Secondary | ICD-10-CM

## 2020-05-08 DIAGNOSIS — I480 Paroxysmal atrial fibrillation: Secondary | ICD-10-CM

## 2020-05-08 MED ORDER — APIXABAN 5 MG PO TABS: 5.0000 mg | ORAL_TABLET | Freq: Two times a day (BID) | ORAL | 3 refills | Status: DC

## 2020-05-20 DIAGNOSIS — M1711 Unilateral primary osteoarthritis, right knee: Secondary | ICD-10-CM | POA: Diagnosis not present

## 2020-05-23 DIAGNOSIS — M25561 Pain in right knee: Secondary | ICD-10-CM | POA: Diagnosis not present

## 2020-05-30 DIAGNOSIS — M25561 Pain in right knee: Secondary | ICD-10-CM | POA: Diagnosis not present

## 2020-05-30 DIAGNOSIS — M1711 Unilateral primary osteoarthritis, right knee: Secondary | ICD-10-CM | POA: Diagnosis not present

## 2020-10-04 DIAGNOSIS — C44612 Basal cell carcinoma of skin of right upper limb, including shoulder: Secondary | ICD-10-CM | POA: Diagnosis not present

## 2020-10-04 DIAGNOSIS — Z85828 Personal history of other malignant neoplasm of skin: Secondary | ICD-10-CM | POA: Diagnosis not present

## 2020-10-04 DIAGNOSIS — L821 Other seborrheic keratosis: Secondary | ICD-10-CM | POA: Diagnosis not present

## 2020-10-04 DIAGNOSIS — D1801 Hemangioma of skin and subcutaneous tissue: Secondary | ICD-10-CM | POA: Diagnosis not present

## 2020-10-04 DIAGNOSIS — L570 Actinic keratosis: Secondary | ICD-10-CM | POA: Diagnosis not present

## 2020-11-15 DIAGNOSIS — M1711 Unilateral primary osteoarthritis, right knee: Secondary | ICD-10-CM | POA: Diagnosis not present

## 2020-11-16 ENCOUNTER — Emergency Department (HOSPITAL_COMMUNITY): Payer: Medicare PPO

## 2020-11-16 ENCOUNTER — Other Ambulatory Visit: Payer: Self-pay

## 2020-11-16 ENCOUNTER — Emergency Department (HOSPITAL_COMMUNITY)
Admission: EM | Admit: 2020-11-16 | Discharge: 2020-11-16 | Disposition: A | Discharge status: Home / Self Care | Payer: Medicare PPO | Attending: Emergency Medicine | Admitting: Emergency Medicine

## 2020-11-16 DIAGNOSIS — Z96612 Presence of left artificial shoulder joint: Secondary | ICD-10-CM | POA: Insufficient documentation

## 2020-11-16 DIAGNOSIS — I10 Essential (primary) hypertension: Secondary | ICD-10-CM | POA: Diagnosis not present

## 2020-11-16 DIAGNOSIS — Z20822 Contact with and (suspected) exposure to covid-19: Secondary | ICD-10-CM | POA: Insufficient documentation

## 2020-11-16 DIAGNOSIS — R443 Hallucinations, unspecified: Secondary | ICD-10-CM | POA: Insufficient documentation

## 2020-11-16 DIAGNOSIS — R4182 Altered mental status, unspecified: Secondary | ICD-10-CM | POA: Diagnosis not present

## 2020-11-16 DIAGNOSIS — Z87891 Personal history of nicotine dependence: Secondary | ICD-10-CM | POA: Diagnosis not present

## 2020-11-16 DIAGNOSIS — R410 Disorientation, unspecified: Secondary | ICD-10-CM | POA: Diagnosis not present

## 2020-11-16 DIAGNOSIS — Z96661 Presence of right artificial ankle joint: Secondary | ICD-10-CM | POA: Insufficient documentation

## 2020-11-16 DIAGNOSIS — Z79899 Other long term (current) drug therapy: Secondary | ICD-10-CM | POA: Diagnosis not present

## 2020-11-16 DIAGNOSIS — Z7901 Long term (current) use of anticoagulants: Secondary | ICD-10-CM | POA: Insufficient documentation

## 2020-11-16 LAB — CBC WITH DIFFERENTIAL/PLATELET
Abs Immature Granulocytes: 0.06 10*3/uL (ref 0.00–0.07)
Basophils Absolute: 0 10*3/uL (ref 0.0–0.1)
Basophils Relative: 0 %
Eosinophils Absolute: 0 10*3/uL (ref 0.0–0.5)
Eosinophils Relative: 0 %
HCT: 44.7 % (ref 39.0–52.0)
Hemoglobin: 14.7 g/dL (ref 13.0–17.0)
Immature Granulocytes: 0 %
Lymphocytes Relative: 8 %
Lymphs Abs: 1.4 10*3/uL (ref 0.7–4.0)
MCH: 30.8 pg (ref 26.0–34.0)
MCHC: 32.9 g/dL (ref 30.0–36.0)
MCV: 93.5 fL (ref 80.0–100.0)
Monocytes Absolute: 1.4 10*3/uL — ABNORMAL HIGH (ref 0.1–1.0)
Monocytes Relative: 8 %
Neutro Abs: 14.1 10*3/uL — ABNORMAL HIGH (ref 1.7–7.7)
Neutrophils Relative %: 84 %
Platelets: 241 10*3/uL (ref 150–400)
RBC: 4.78 MIL/uL (ref 4.22–5.81)
RDW: 13.2 % (ref 11.5–15.5)
WBC: 16.9 10*3/uL — ABNORMAL HIGH (ref 4.0–10.5)
nRBC: 0 % (ref 0.0–0.2)

## 2020-11-16 LAB — URINALYSIS, ROUTINE W REFLEX MICROSCOPIC
Bilirubin Urine: NEGATIVE
Glucose, UA: NEGATIVE mg/dL
Hgb urine dipstick: NEGATIVE
Ketones, ur: NEGATIVE mg/dL
Leukocytes,Ua: NEGATIVE
Nitrite: NEGATIVE
Protein, ur: NEGATIVE mg/dL
Specific Gravity, Urine: 1.009 (ref 1.005–1.030)
pH: 5 (ref 5.0–8.0)

## 2020-11-16 LAB — COMPREHENSIVE METABOLIC PANEL
ALT: 18 U/L (ref 0–44)
AST: 22 U/L (ref 15–41)
Albumin: 4.3 g/dL (ref 3.5–5.0)
Alkaline Phosphatase: 55 U/L (ref 38–126)
Anion gap: 11 (ref 5–15)
BUN: 26 mg/dL — ABNORMAL HIGH (ref 8–23)
CO2: 23 mmol/L (ref 22–32)
Calcium: 9.5 mg/dL (ref 8.9–10.3)
Chloride: 102 mmol/L (ref 98–111)
Creatinine, Ser: 1.25 mg/dL — ABNORMAL HIGH (ref 0.61–1.24)
GFR, Estimated: 57 mL/min — ABNORMAL LOW (ref >60)
Glucose, Bld: 143 mg/dL — ABNORMAL HIGH (ref 70–99)
Potassium: 4.1 mmol/L (ref 3.5–5.1)
Sodium: 136 mmol/L (ref 135–145)
Total Bilirubin: 0.4 mg/dL (ref 0.3–1.2)
Total Protein: 7.6 g/dL (ref 6.5–8.1)

## 2020-11-16 LAB — RAPID URINE DRUG SCREEN, HOSP PERFORMED
Amphetamines: NOT DETECTED
Barbiturates: NOT DETECTED
Benzodiazepines: NOT DETECTED
Cocaine: NOT DETECTED
Opiates: NOT DETECTED
Tetrahydrocannabinol: NOT DETECTED

## 2020-11-16 LAB — ETHANOL: Alcohol, Ethyl (B): <10 mg/dL (ref <10)

## 2020-11-16 LAB — CBG MONITORING, ED: Glucose-Capillary: 148 mg/dL — ABNORMAL HIGH (ref 70–99)

## 2020-11-16 MED ORDER — SODIUM CHLORIDE 0.9 % IV BOLUS
1000.0000 mL | Freq: Once | INTRAVENOUS | Status: AC
  Administered 2020-11-16: 1000 mL via INTRAVENOUS

## 2020-11-16 MED ORDER — SODIUM CHLORIDE 0.9 % IV SOLN: INTRAVENOUS | Status: DC
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
## 2020-11-16 NOTE — ED Triage Notes (Signed)
Patient had a cortisone shot in the right posterior knee. Patient's wife reports that he has been confused, hallucinating, and wandering around in the house since yesterday after lunch. Patient states his right leg is heavy below the knee.

## 2020-11-16 NOTE — Discharge Instructions (Addendum)
Return here at once should you develop weakness to one side of your body, persistent confusion, fever, or any other problems

## 2020-11-17 LAB — SARS CORONAVIRUS 2 (TAT 6-24 HRS): SARS Coronavirus 2: NEGATIVE

## 2020-11-18 LAB — URINE CULTURE: Culture: NO GROWTH

## 2020-11-20 ENCOUNTER — Telehealth: Payer: Self-pay | Admitting: Neurology

## 2020-11-20 NOTE — Telephone Encounter (Signed)
Pt.'s wife Jared Benjamin states that their minister talked with Dr. Brett Fairy for husband to be seen here at our office. Wife states that on Sat. he started having an episode with his mind. Please advise.

## 2020-11-20 NOTE — Telephone Encounter (Signed)
Called the wife back, there was no answer.  Advised on voicemail that since Mr. Luty is not a patient here we would need to receive a referral from his primary care doctor in order to get him scheduled.  Advised the wife to contact the primary care to have the referral sent to our office.  Advised I would monitor to see if an opening is available for the patient.  Informed that our referrals team once they receive the referral, will be in contact to schedule Mr. Klett.  **If patient or wife returns call please advise that Dr. Brett Fairy would be happy to see him as a patient but a referral is necessary since he is not an established patient.

## 2020-12-11 DIAGNOSIS — I700 Atherosclerosis of aorta: Secondary | ICD-10-CM | POA: Diagnosis not present

## 2020-12-11 DIAGNOSIS — M179 Osteoarthritis of knee, unspecified: Secondary | ICD-10-CM | POA: Diagnosis not present

## 2020-12-11 DIAGNOSIS — E7800 Pure hypercholesterolemia, unspecified: Secondary | ICD-10-CM | POA: Diagnosis not present

## 2020-12-11 DIAGNOSIS — Z79899 Other long term (current) drug therapy: Secondary | ICD-10-CM | POA: Diagnosis not present

## 2020-12-11 DIAGNOSIS — Z125 Encounter for screening for malignant neoplasm of prostate: Secondary | ICD-10-CM | POA: Diagnosis not present

## 2020-12-11 DIAGNOSIS — I480 Paroxysmal atrial fibrillation: Secondary | ICD-10-CM | POA: Diagnosis not present

## 2020-12-11 DIAGNOSIS — Z1389 Encounter for screening for other disorder: Secondary | ICD-10-CM | POA: Diagnosis not present

## 2020-12-11 DIAGNOSIS — D6869 Other thrombophilia: Secondary | ICD-10-CM | POA: Diagnosis not present

## 2020-12-11 DIAGNOSIS — I714 Abdominal aortic aneurysm, without rupture: Secondary | ICD-10-CM | POA: Diagnosis not present

## 2020-12-11 DIAGNOSIS — Z0000 Encounter for general adult medical examination without abnormal findings: Secondary | ICD-10-CM | POA: Diagnosis not present

## 2020-12-11 DIAGNOSIS — I10 Essential (primary) hypertension: Secondary | ICD-10-CM | POA: Diagnosis not present

## 2020-12-12 ENCOUNTER — Other Ambulatory Visit: Payer: Self-pay | Admitting: Family Medicine

## 2020-12-12 DIAGNOSIS — I714 Abdominal aortic aneurysm, without rupture, unspecified: Secondary | ICD-10-CM

## 2020-12-12 DIAGNOSIS — I700 Atherosclerosis of aorta: Secondary | ICD-10-CM

## 2020-12-23 DIAGNOSIS — H353131 Nonexudative age-related macular degeneration, bilateral, early dry stage: Secondary | ICD-10-CM | POA: Diagnosis not present

## 2020-12-31 DIAGNOSIS — M222X1 Patellofemoral disorders, right knee: Secondary | ICD-10-CM | POA: Diagnosis not present

## 2020-12-31 DIAGNOSIS — M6281 Muscle weakness (generalized): Secondary | ICD-10-CM | POA: Diagnosis not present

## 2021-01-02 ENCOUNTER — Ambulatory Visit
Admission: RE | Admit: 2021-01-02 | Discharge: 2021-01-02 | Disposition: A | Discharge status: Home / Self Care | Payer: Medicare PPO | Source: Ambulatory Visit | Attending: Family Medicine | Admitting: Family Medicine

## 2021-01-02 DIAGNOSIS — I714 Abdominal aortic aneurysm, without rupture, unspecified: Secondary | ICD-10-CM

## 2021-01-02 DIAGNOSIS — I700 Atherosclerosis of aorta: Secondary | ICD-10-CM

## 2021-01-08 ENCOUNTER — Ambulatory Visit
Admission: RE | Admit: 2021-01-08 | Discharge: 2021-01-08 | Disposition: A | Discharge status: Home / Self Care | Payer: Medicare PPO | Source: Ambulatory Visit | Attending: Family Medicine | Admitting: Family Medicine

## 2021-01-08 ENCOUNTER — Other Ambulatory Visit: Payer: Self-pay

## 2021-01-08 DIAGNOSIS — I700 Atherosclerosis of aorta: Secondary | ICD-10-CM | POA: Diagnosis not present

## 2021-03-03 DIAGNOSIS — M79604 Pain in right leg: Secondary | ICD-10-CM | POA: Diagnosis not present

## 2021-03-03 DIAGNOSIS — M7989 Other specified soft tissue disorders: Secondary | ICD-10-CM | POA: Diagnosis not present

## 2021-05-11 ENCOUNTER — Other Ambulatory Visit: Payer: Self-pay | Admitting: Cardiology

## 2021-05-11 DIAGNOSIS — I480 Paroxysmal atrial fibrillation: Secondary | ICD-10-CM

## 2021-05-12 NOTE — Telephone Encounter (Signed)
87m, 85.3kg, scr 1.25 11/16/20, lovw/jordan 05/08/2020
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
## 2021-06-26 ENCOUNTER — Ambulatory Visit: Payer: Medicare PPO | Admitting: General Practice

## 2021-06-26 ENCOUNTER — Encounter: Payer: Self-pay | Admitting: General Practice

## 2021-06-26 ENCOUNTER — Other Ambulatory Visit: Payer: Self-pay

## 2021-06-26 VITALS — BP 130/70 | HR 74 | Ht 71.0 in | Wt 198.8 lb

## 2021-06-26 DIAGNOSIS — I10 Essential (primary) hypertension: Secondary | ICD-10-CM

## 2021-06-26 DIAGNOSIS — I480 Paroxysmal atrial fibrillation: Secondary | ICD-10-CM | POA: Diagnosis not present

## 2021-06-26 NOTE — Patient Instructions (Signed)
Medication Instructions:  The current medical regimen is effective;  continue present plan and medications as directed. Please refer to the Current Medication list given to you today.  *If you need a refill on your cardiac medications before your next appointment, please call your pharmacy*  Lab Work:   Testing/Procedures:  NONE    NONE  Follow-Up: Your next appointment:  12 month(s) In Person with You may see Peter Martinique, MD, Coletta Memos, FNP-C or one of the following Advanced Practice Providers on your designated Care Team:  Almyra Deforest, PA-C  Fabian Sharp, [place] or Roby Lofts, PA-C   Please call our office 2 months in advance to schedule this appointment   At [facility], you and your health needs are our priority.  As part of our continuing mission to provide you with exceptional heart care, we have created designated Provider Care Teams.  These Care Teams include your primary Cardiologist (physician) and Advanced Practice Providers (APPs -  Physician Assistants and Nurse Practitioners) who all work together to provide you with the care you need, when you need it.

## 2021-06-27 IMAGING — CT CT HEAD W/O CM
3 series · 15 of 47 positions shown, 18 images · non-contrast
Comparison: None.

CLINICAL DATA: Altered mental status

EXAM:
CT HEAD WITHOUT CONTRAST
TECHNIQUE: Contiguous axial images were obtained from the base of the skull
through the vertex without intravenous contrast.

[Series 3: head wo · axial · 0.47mm/px · z∈[-116,+34]mm · 9 of 36 slices shown, 12 images]
[im 3/36  brain]
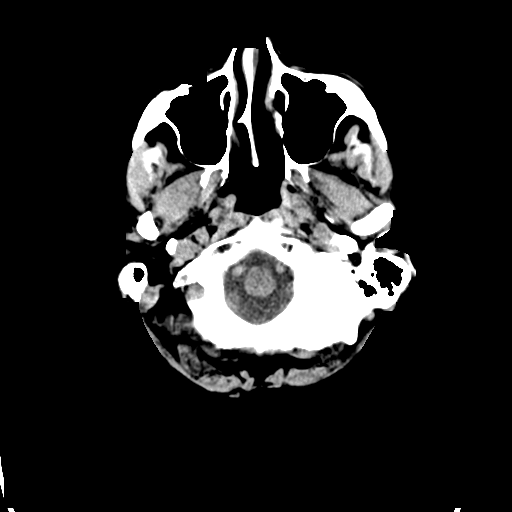
[im 3/36  bone]
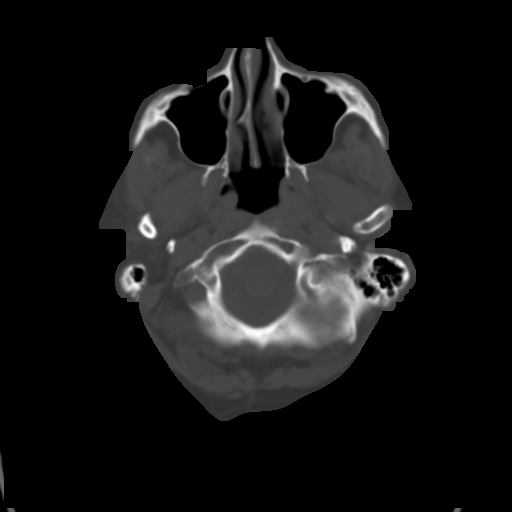
[im 7/36  brain]
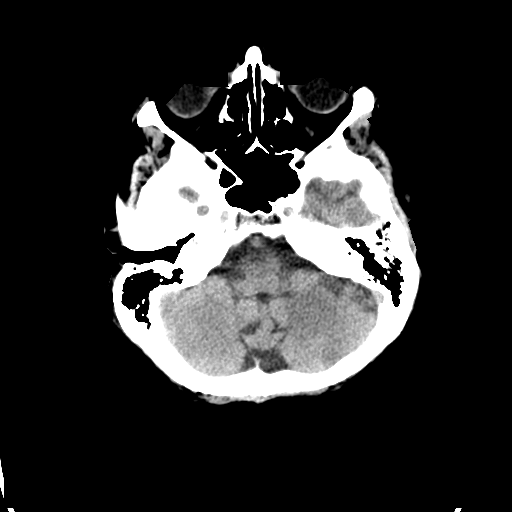
[im 10/36  brain]
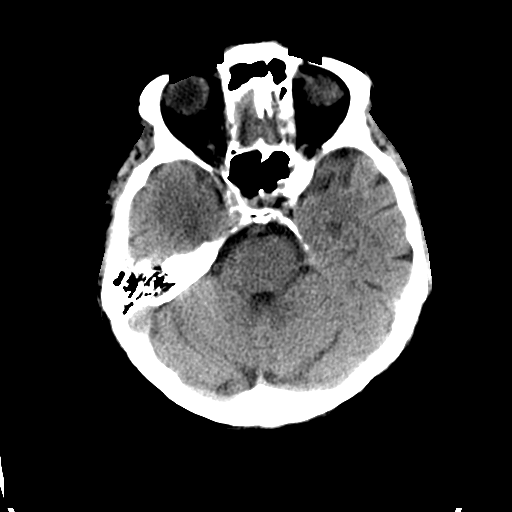
[im 14/36  brain]
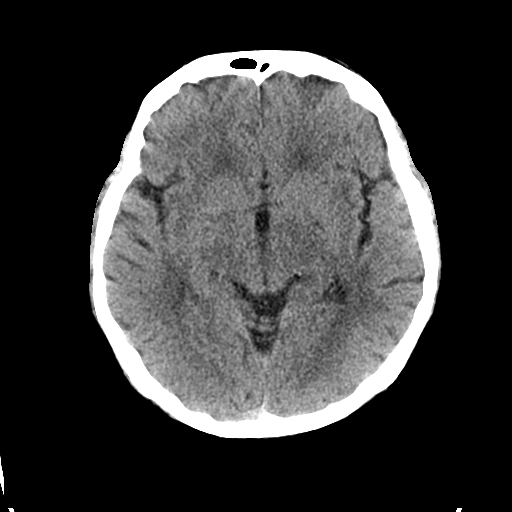
[im 19/36  brain]
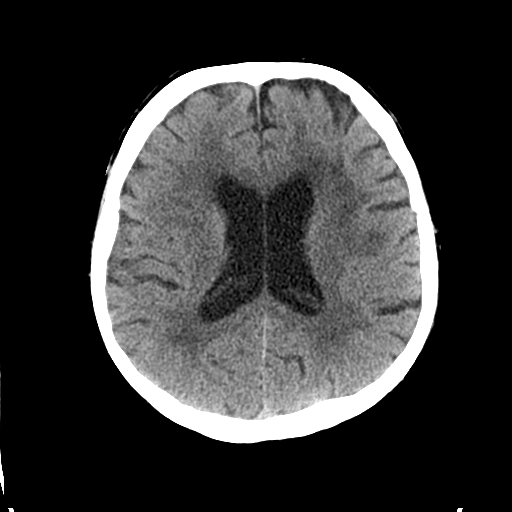
[im 19/36  bone]
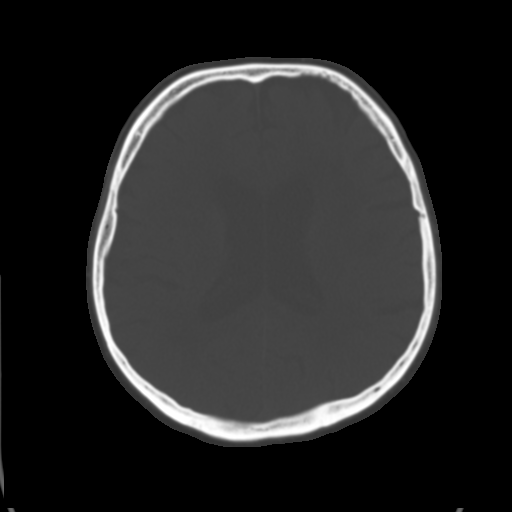
[im 22/36  brain]
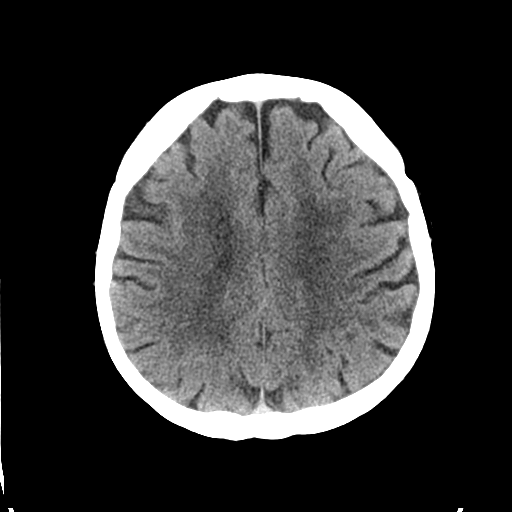
[im 26/36  brain]
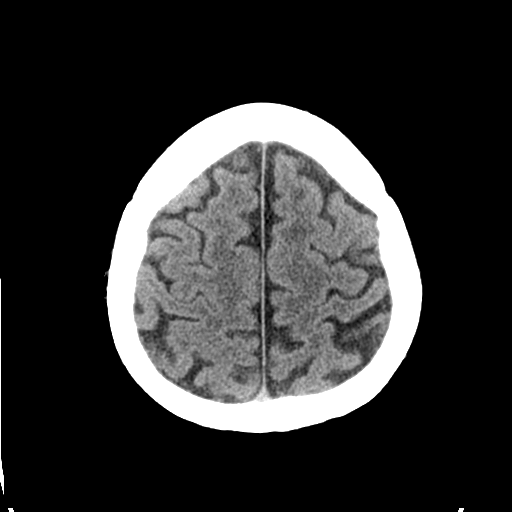
[im 29/36  brain]
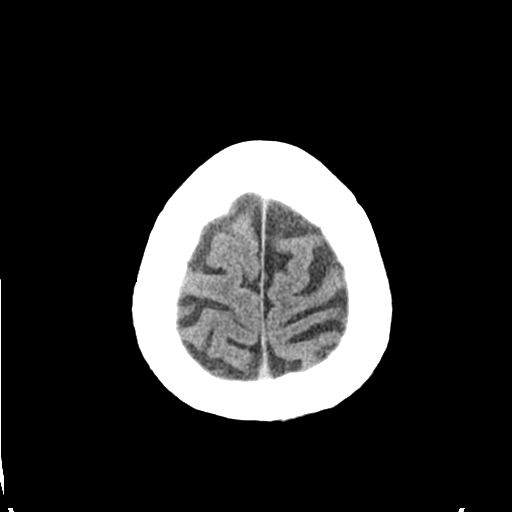
[im 33/36  brain]
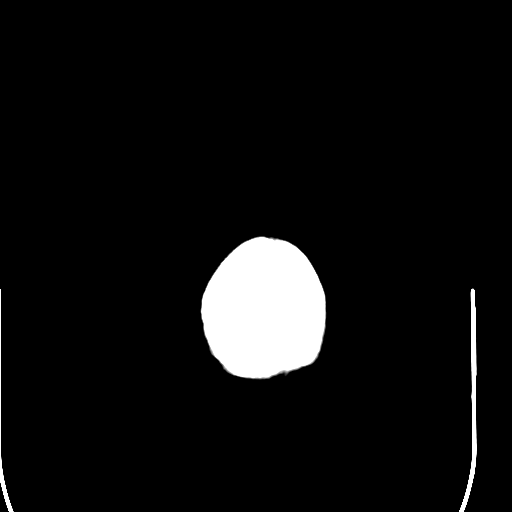
[im 33/36  bone]
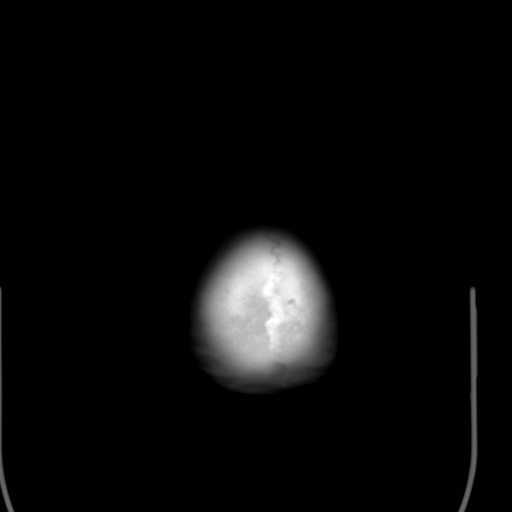

[Series 5: coronal soft tissue · coronal · 0.34mm/px · 3 of 76 slices shown]
[im 26/76  brain]
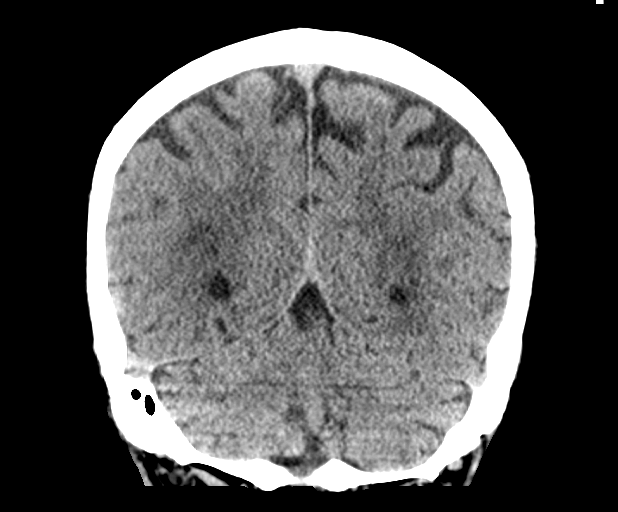
[im 34/76  brain]
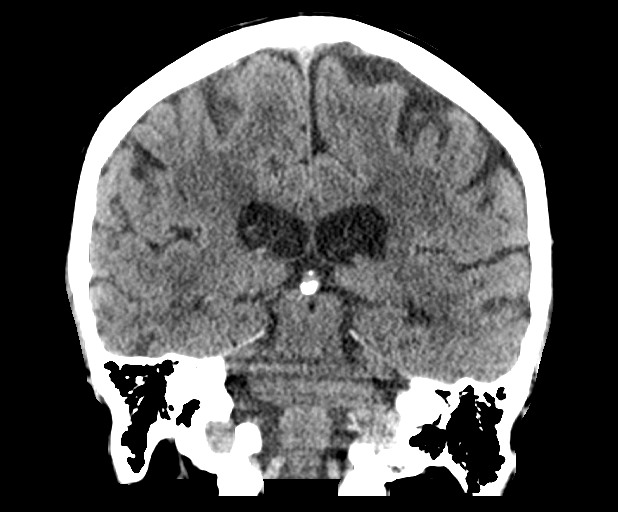
[im 42/76  brain]
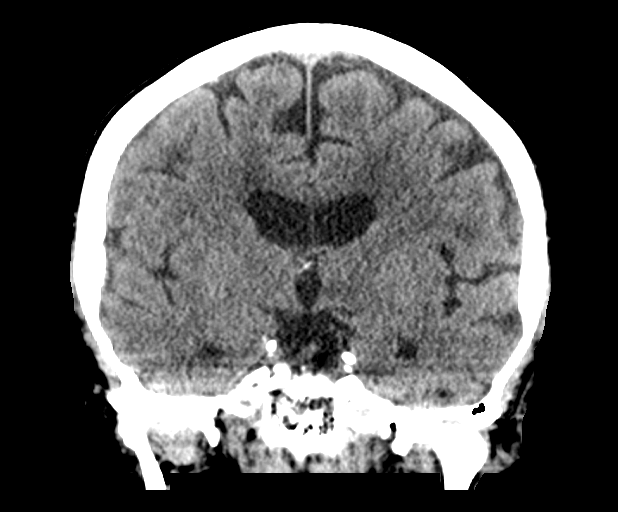

[Series 6: sagittal soft tissue · sagittal · 0.34mm/px · 3 of 71 slices shown]
[im 24/71  brain]
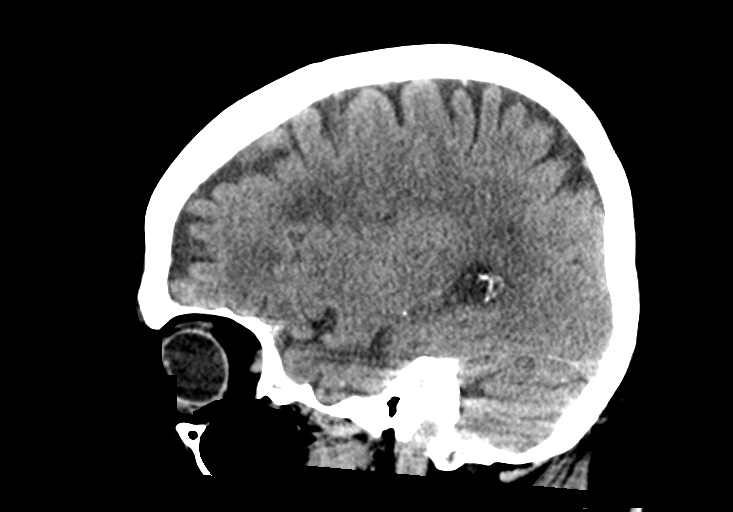
[im 36/71  brain]
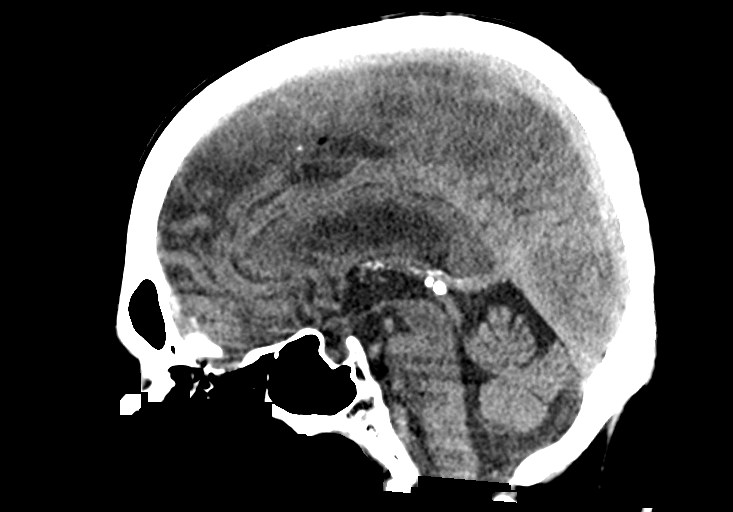
[im 47/71  brain]
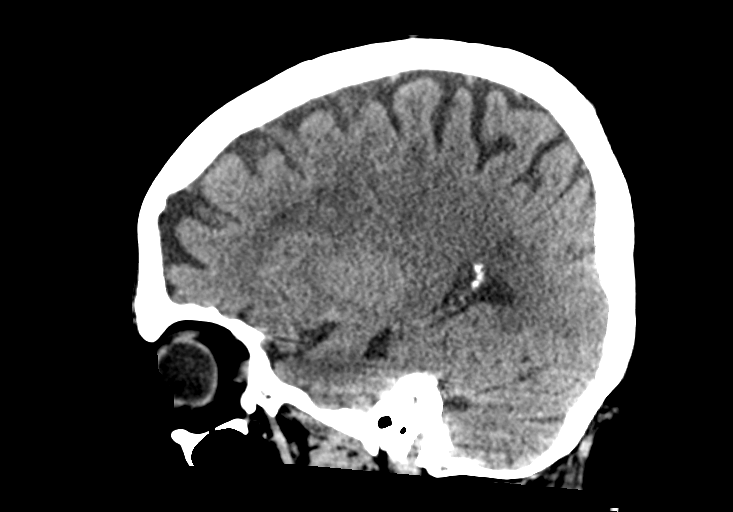

[15 of 47 positions shown; findings below may reference images not displayed]

FINDINGS: Brain: No evidence of acute infarction, hemorrhage, hydrocephalus,
extra-axial collection or mass lesion/mass effect. Moderate
low-density changes within the periventricular and subcortical white
matter compatible with chronic microvascular ischemic change. Mild
diffuse cerebral volume loss.

Vascular: Atherosclerotic calcifications involving the large vessels
of the skull base. No unexpected hyperdense vessel.

Skull: Normal. Negative for fracture or focal lesion.

Sinuses/Orbits: No acute finding.

Other: None.
IMPRESSION: 1. No acute intracranial findings.
2. Chronic microvascular ischemic change and cerebral volume loss.

## 2021-07-21 DIAGNOSIS — E785 Hyperlipidemia, unspecified: Secondary | ICD-10-CM | POA: Diagnosis not present

## 2021-07-21 DIAGNOSIS — Z811 Family history of alcohol abuse and dependence: Secondary | ICD-10-CM | POA: Diagnosis not present

## 2021-07-21 DIAGNOSIS — I10 Essential (primary) hypertension: Secondary | ICD-10-CM | POA: Diagnosis not present

## 2021-07-21 DIAGNOSIS — Z7901 Long term (current) use of anticoagulants: Secondary | ICD-10-CM | POA: Diagnosis not present

## 2021-07-21 DIAGNOSIS — N529 Male erectile dysfunction, unspecified: Secondary | ICD-10-CM | POA: Diagnosis not present

## 2021-07-21 DIAGNOSIS — I4891 Unspecified atrial fibrillation: Secondary | ICD-10-CM | POA: Diagnosis not present

## 2021-07-21 DIAGNOSIS — D6869 Other thrombophilia: Secondary | ICD-10-CM | POA: Diagnosis not present

## 2021-07-21 DIAGNOSIS — M1990 Unspecified osteoarthritis, unspecified site: Secondary | ICD-10-CM | POA: Diagnosis not present

## 2021-07-21 DIAGNOSIS — Z8249 Family history of ischemic heart disease and other diseases of the circulatory system: Secondary | ICD-10-CM | POA: Diagnosis not present

## 2021-08-19 IMAGING — US US AORTA
1 series · 13 of 24 positions shown · non-contrast
Comparison: Ultrasound 12/16/2017

CLINICAL DATA: Aortic atherosclerosis

EXAM:
ULTRASOUND OF ABDOMINAL AORTA
TECHNIQUE: Ultrasound examination of the abdominal aorta and proximal common
iliac arteries was performed to evaluate for aneurysm. Additional
color and Doppler images of the distal aorta were obtained to
document patency.

[Series 1: us aorta · 0.28mm/px · 13 of 24 slices shown]
[im 1/24]
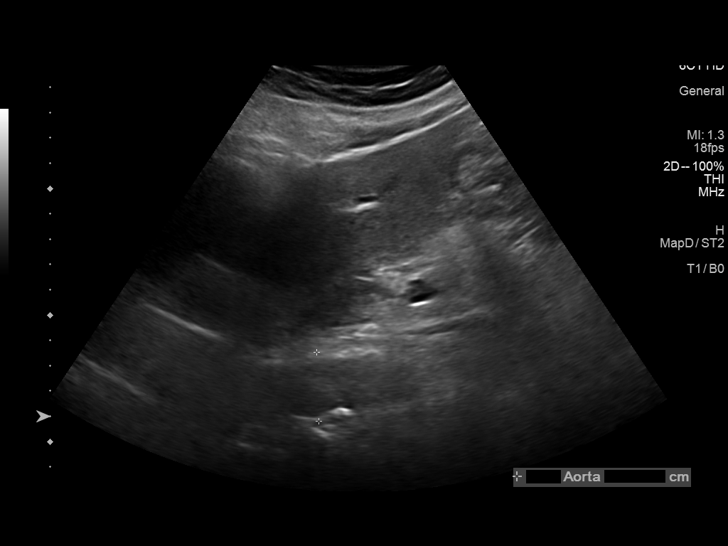
[im 3/24]
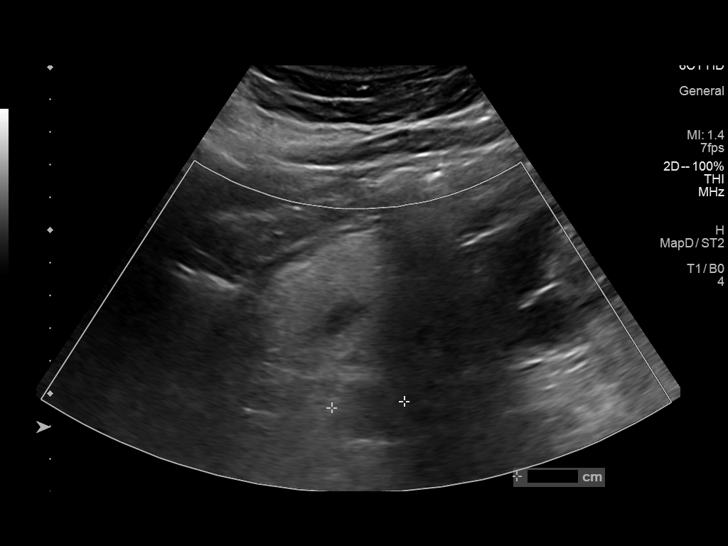
[im 5/24]
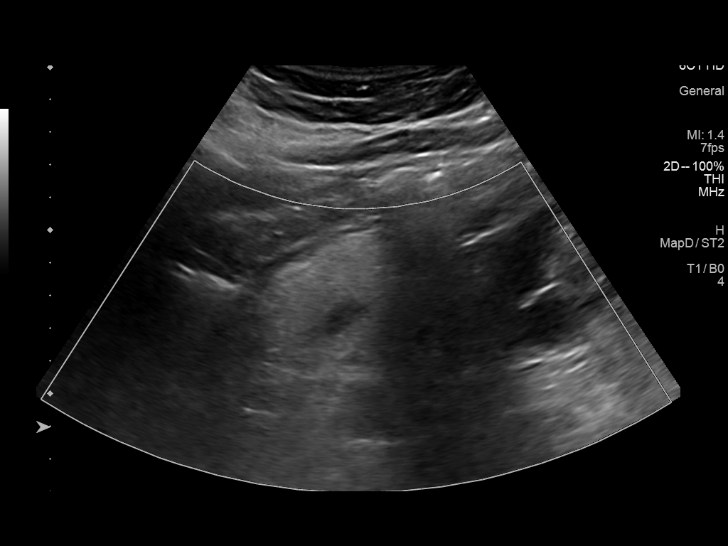
[im 7/24]
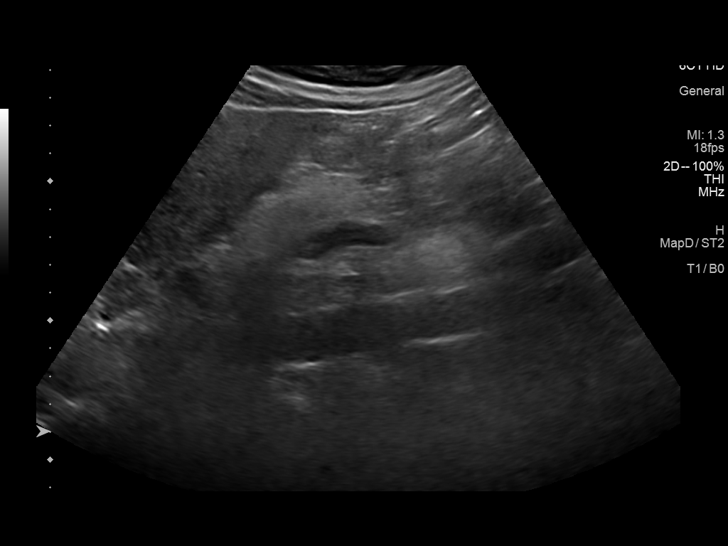
[im 9/24]
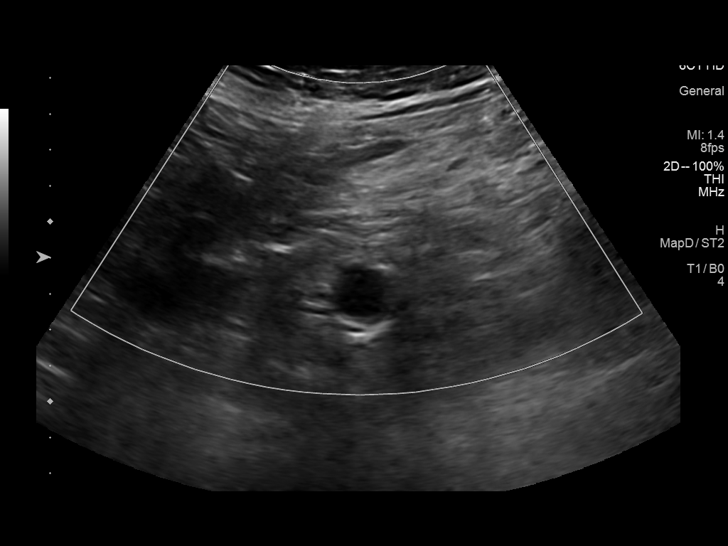
[im 11/24]
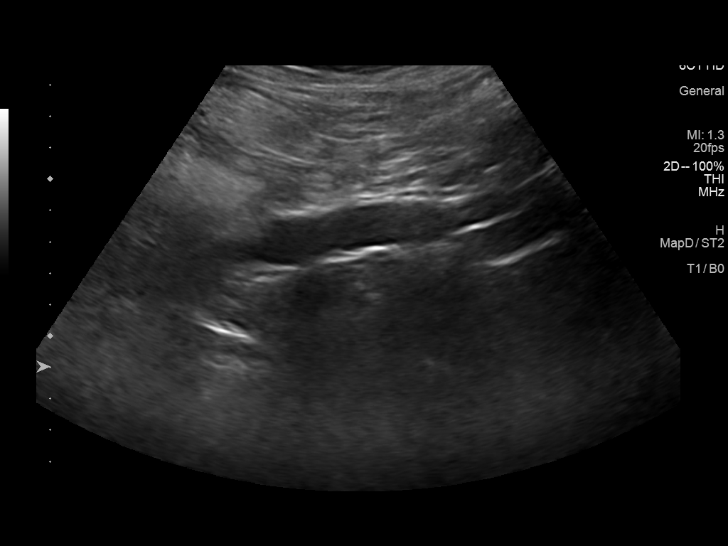
[im 13/24]
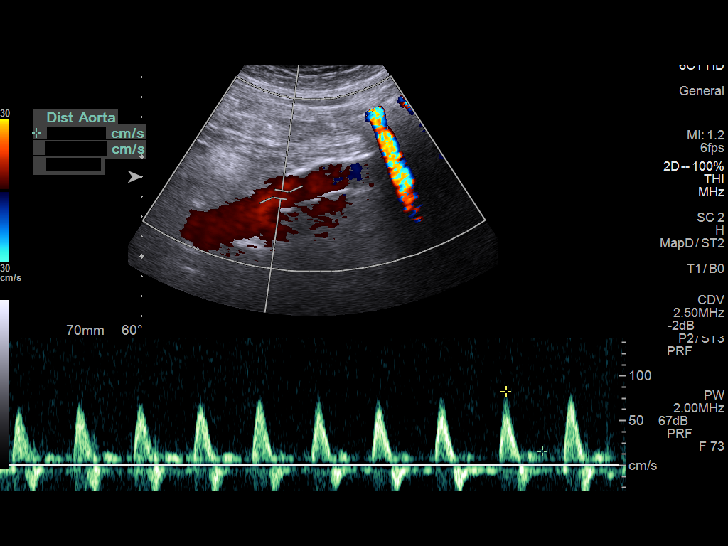
[im 14/24]
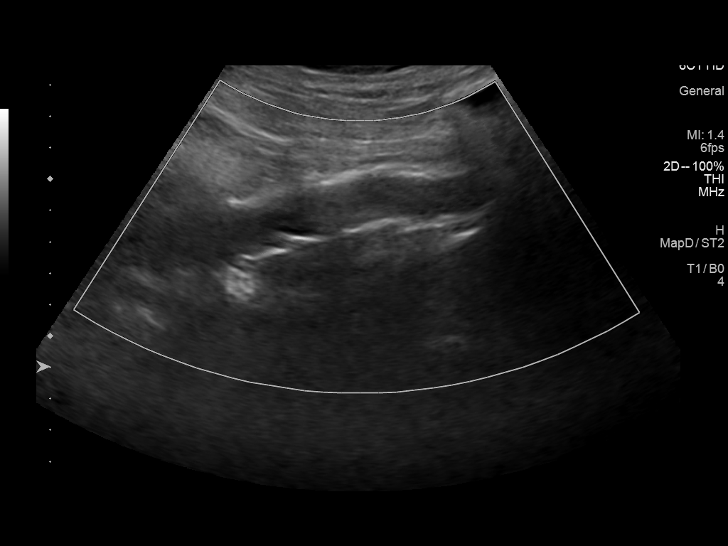
[im 16/24]
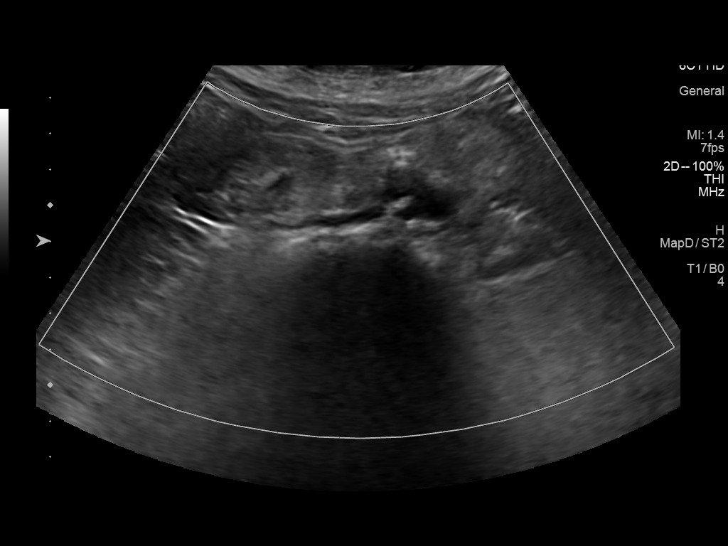
[im 18/24]
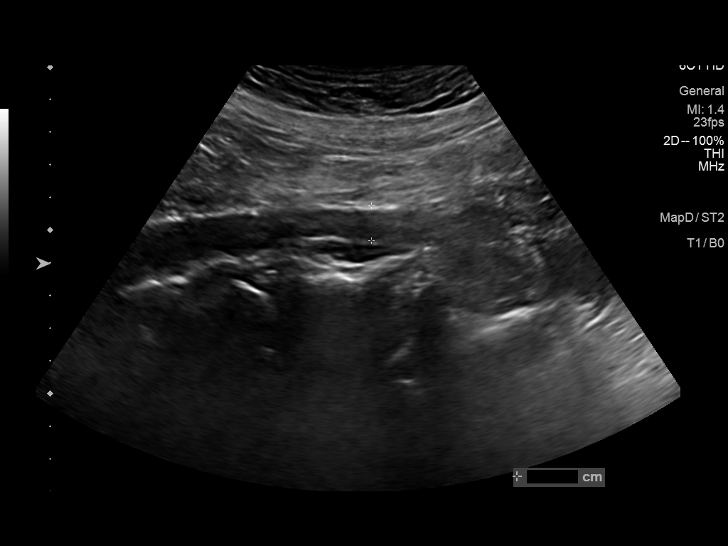
[im 20/24]
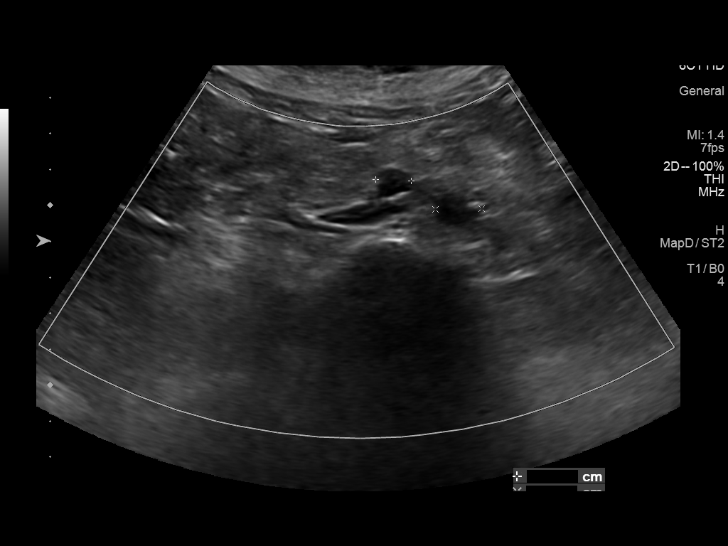
[im 22/24]
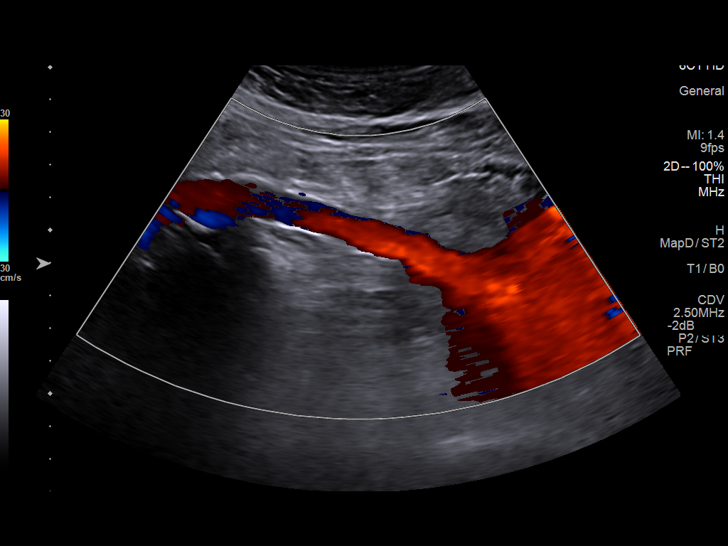
[im 24/24]
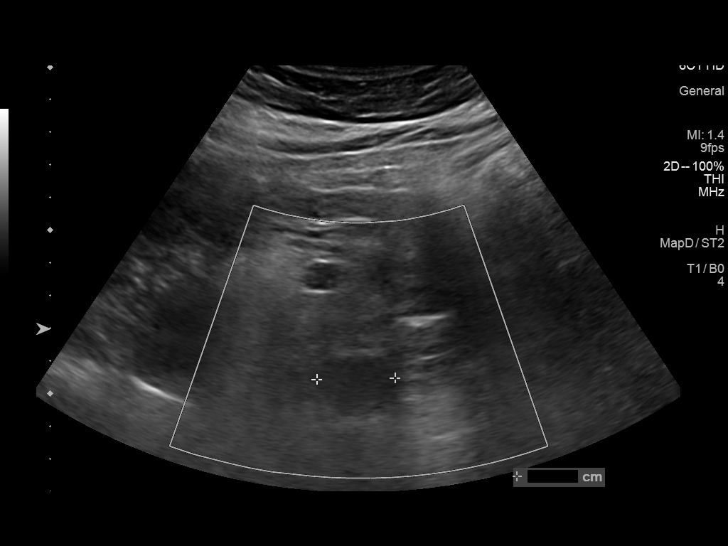

[13 of 24 positions shown; findings below may reference images not displayed]

FINDINGS: Abdominal aortic measurements as follows:

Proximal:  2.7 cm AP x 2.2 cm transverse

Mid: 2 cm AP x 1.8 cm transverse

Distal: 2.1 cm AP x 1.8 cm transverse. Previous examination measured
an area of possible aneurysmal dilatation and included heterogenous
echogenic area within the posterior lumen. When similar measurement
is made to the previous exam, AP aortic diameter measurement is
cm.
Patent: Yes, peak systolic velocity is 83 cm/s

Right common iliac artery: 1.1 cm AP x 1 cm transverse

Left common iliac artery: 1.2 cm AP x 1.3 cm transverse
IMPRESSION: 1. Previous examination demonstrated possible distal aortic aneurysm
and included a heterogenous echogenic area along the posterior aorta
potentially representing calcification and plaque. When measured in
a similar fashion on today's study, maximum AP aortic diameter is
unchanged measuring 3.3 cm. CT angiography might better evaluate for
the presence of true aneurysm given the equivocal ultrasound
findings.

## 2021-10-08 DIAGNOSIS — L814 Other melanin hyperpigmentation: Secondary | ICD-10-CM | POA: Diagnosis not present

## 2021-10-08 DIAGNOSIS — C44712 Basal cell carcinoma of skin of right lower limb, including hip: Secondary | ICD-10-CM | POA: Diagnosis not present

## 2021-10-08 DIAGNOSIS — L821 Other seborrheic keratosis: Secondary | ICD-10-CM | POA: Diagnosis not present

## 2021-10-08 DIAGNOSIS — L570 Actinic keratosis: Secondary | ICD-10-CM | POA: Diagnosis not present

## 2021-10-08 DIAGNOSIS — L720 Epidermal cyst: Secondary | ICD-10-CM | POA: Diagnosis not present

## 2021-10-08 DIAGNOSIS — Z85828 Personal history of other malignant neoplasm of skin: Secondary | ICD-10-CM | POA: Diagnosis not present

## 2021-10-22 DIAGNOSIS — J1100 Influenza due to unidentified influenza virus with unspecified type of pneumonia: Secondary | ICD-10-CM | POA: Diagnosis not present

## 2021-10-22 DIAGNOSIS — J9811 Atelectasis: Secondary | ICD-10-CM | POA: Diagnosis not present

## 2021-11-24 ENCOUNTER — Other Ambulatory Visit: Payer: Self-pay | Admitting: Cardiology

## 2021-11-24 DIAGNOSIS — I480 Paroxysmal atrial fibrillation: Secondary | ICD-10-CM

## 2021-11-24 NOTE — Telephone Encounter (Signed)
Prescription refill request for Eliquis received. Indication:Afib Last office visit:9/22 Scr:1.3 Age: 87 Weight:90.2 kg  Prescription refilled

## 2021-12-17 DIAGNOSIS — I480 Paroxysmal atrial fibrillation: Secondary | ICD-10-CM | POA: Diagnosis not present

## 2021-12-17 DIAGNOSIS — I10 Essential (primary) hypertension: Secondary | ICD-10-CM | POA: Diagnosis not present

## 2021-12-17 DIAGNOSIS — Z0000 Encounter for general adult medical examination without abnormal findings: Secondary | ICD-10-CM | POA: Diagnosis not present

## 2021-12-17 DIAGNOSIS — I700 Atherosclerosis of aorta: Secondary | ICD-10-CM | POA: Diagnosis not present

## 2021-12-17 DIAGNOSIS — E7800 Pure hypercholesterolemia, unspecified: Secondary | ICD-10-CM | POA: Diagnosis not present

## 2021-12-17 DIAGNOSIS — I714 Abdominal aortic aneurysm, without rupture, unspecified: Secondary | ICD-10-CM | POA: Diagnosis not present

## 2021-12-17 DIAGNOSIS — Z79899 Other long term (current) drug therapy: Secondary | ICD-10-CM | POA: Diagnosis not present

## 2021-12-17 DIAGNOSIS — Z125 Encounter for screening for malignant neoplasm of prostate: Secondary | ICD-10-CM | POA: Diagnosis not present

## 2021-12-17 DIAGNOSIS — D6869 Other thrombophilia: Secondary | ICD-10-CM | POA: Diagnosis not present

## 2021-12-17 DIAGNOSIS — N183 Chronic kidney disease, stage 3 unspecified: Secondary | ICD-10-CM | POA: Diagnosis not present

## 2021-12-23 DIAGNOSIS — H35033 Hypertensive retinopathy, bilateral: Secondary | ICD-10-CM | POA: Diagnosis not present

## 2021-12-24 ENCOUNTER — Telehealth: Payer: Self-pay | Admitting: Physician Assistant

## 2021-12-24 NOTE — Telephone Encounter (Signed)
Scheduled appt per 2/24 referral. Pt is aware of appt date and time. Pt is aware to arrive 15 mins prior to appt time and to bring and updated insurance card. Pt is aware of appt location.   °

## 2022-01-06 NOTE — Progress Notes (Signed)
? ? [place] ?[phone]   [phone] ? ?CONSULT NOTE ? ?REFERRING PHYSICIAN: Dr. Harrington Challenger ? ?REASON FOR CONSULTATION:  ?Monocytosis ? ?HPI ?Jared Benjamin is a 87 y.o. male with a past medical history significant for hypertension, atrial fibrillation, shoulder arthritis, and hyperlipidemia is referred to the clinic for evaluation of monocytosis. ? ?The patient had a follow-up visit with his PCP a few weeks ago which showed elevated relative monocytes at 14.2%.  The patient's absolute monocyte count was 0.8 and her eosinophils were 0.5.  The relatively eosinophil count was 8.0%.  The patient reportedly had elevated monocyte counts for approximately 5 years.  The patient wished to be referred to our clinic to ensure that this is nothing concerning.   ? ?Overall, the patient is feeling "wonderful".  Denies any fever, chills, night sweats, or unexplained weight loss.  He started a low carb diet 1 week ago and lost 6 lbs. Denies any history of anemia or thrombocytopenia.  Denies any abdominal bloating, distention, or early satiety.  Denies any recent signs and symptoms of infection. He did note that he had a mild cold around the time he had his labs drawn at his PCP. He also had COVID-19 around Christmas this year. Denies any abnormal bleeding.  Denies any history of any autoimmune disorders such as IBD sarcoidosis, rheumatoid arthritis.  The patient denies any tobacco use.  He does not get sick frequently. Denies any steroid use. Denies any foreign travel.  The patient denies any lymphadenopathy. ? ?Per chart review, the oldest records available to me are from 2013. The patient has had intermittent periods of slightly elevated monocyte count with the highest being 17% and 1.4. ? ?The patient denies any family medical history for any malignancies, blood disorders, or bone marrow disorders.  The patient's mother had scarlet fever as a child which caused congestive heart failure in  adulthood.  The patient's father was pretty healthy and lived to be 95 but he had some "heart issues" and complications following cataract surgery.  He also had macular degeneration.  The patient's sister had heart problems. ? ?The patient used to work in higher education as a Ecologist.  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  He also worked in [employer].  The patient is married and has 2 sons.  He denies any smoking or street drug use.  The patient rarely drinks alcohol and may have a scotch once every 3 months or so. ? ? ?HPI ? ?Past Medical History:  ?Diagnosis Date  ? Arthritis   ? Atrial fibrillation, transient ([place])   ? Cataract   ? GERD (gastroesophageal reflux disease)   ? Hypertension   ? dr c Jonna Coup  ? PONV (postoperative nausea and vomiting)   ? Thyroid disease   ? ? ?Past Surgical History:  ?Procedure Laterality Date  ? bone spurs    ? off toes  ? COLONOSCOPY WITH PROPOFOL N/A 02/27/2013  ? Procedure: COLONOSCOPY WITH PROPOFOL;  Surgeon: Garlan Fair, MD;  Location: [facility];  Service: Endoscopy;  Laterality: N/A;  ? colonscopy  2008  ? fell    ? shoulder X 2 surgeried re. to fall  ? FRACTURE SURGERY    ? fractured ribs    ? from AA  ? JOINT REPLACEMENT    ? broke rt ankle--fall  ? titanium implant Left 1 week ago  ? upper tooth extraction  ? TOTAL SHOULDER ARTHROPLASTY  08/09/2012  ? Procedure:  TOTAL SHOULDER ARTHROPLASTY;  Surgeon: Nita Sells, MD;  Location: [place];  Service: Orthopedics;  Laterality: Left;  ? ? ?Family History  ?Problem Relation Age of Onset  ? Heart disease Mother   ? Hypertension Father   ? Stroke Father   ? Hypertension Sister   ? ? ?Social History ?Social History  ? ?Tobacco Use  ? Smoking status: Former  ?  Packs/day: 1.00  ?  Years: 22.00  ?  Pack years: 22.00  ?  Types: Cigarettes  ?  Quit date: 10/26/1977  ?  Years since quitting: 44.2  ? Smokeless tobacco: Never  ?Vaping Use  ? Vaping Use: Never used  ?Substance Use Topics  ? Alcohol use: Yes  ?   Comment: weekly  ? Drug use: No  ? ? ?Allergies  ?Allergen Reactions  ? Lipitor [Atorvastatin] Other (See Comments)  ?  achey   ? ? ?Current Outpatient Medications  ?Medication Sig Dispense Refill  ? acetaminophen (TYLENOL) 500 MG tablet Take 1,500 mg by mouth once.    ? Apoaequorin (PREVAGEN) 10 MG CAPS Take by mouth. Take 1 tablet daily    ? ELIQUIS 5 MG TABS tablet TAKE ONE TABLET BY MOUTH TWICE DAILY 180 tablet 1  ? irbesartan (AVAPRO) 150 MG tablet Take 150 mg by mouth daily with supper.    ? Red Yeast Rice 600 MG CAPS Take 600 mg by mouth daily with supper.    ? ?No current facility-administered medications for this visit.  ? ? ?REVIEW OF SYSTEMS:   ?Review of Systems  ?Constitutional: Negative for appetite change, chills, fatigue, fever and unexpected weight change.  ?HENT:   Negative for mouth sores, nosebleeds, sore throat and trouble swallowing.   ?Eyes: Negative for eye problems and icterus.  ?Respiratory: Negative for cough, hemoptysis, shortness of breath and wheezing.   ?Cardiovascular: Negative for chest pain and leg swelling.  ?Gastrointestinal: Negative for abdominal pain, constipation, diarrhea, nausea and vomiting.  ?Genitourinary: Negative for bladder incontinence, difficulty urinating, dysuria, frequency and hematuria.   ?Musculoskeletal: Negative for back pain, gait problem, neck pain and neck stiffness.  ?Skin: Negative for itching and rash.  ?Neurological: Negative for dizziness, extremity weakness, gait problem, headaches, light-headedness and seizures.  ?Hematological: Negative for adenopathy. Does not bruise/bleed easily.  ?Psychiatric/Behavioral: Negative for confusion, depression and sleep disturbance. The patient is not nervous/anxious.   ? ? ?PHYSICAL EXAMINATION:  ?Blood pressure 129/67, pulse 80, temperature (!) 97.3 ?F (36.3 ?C), temperature source Tympanic, resp. rate 18, weight 199 lb 3 oz (90.4 kg), SpO2 97 %. ? ?ECOG PERFORMANCE STATUS: 0 ? ?Physical Exam  ?Constitutional:  Oriented to person, place, and time and well-developed, well-nourished, and in no distress. HENT:  ?Head: Normocephalic and atraumatic.  ?Mouth/Throat: Oropharynx is clear and moist. No oropharyngeal exudate.  ?Eyes: Conjunctivae are normal. Right eye exhibits no discharge. Left eye exhibits no discharge. No scleral icterus.  ?Neck: Normal range of motion. Neck supple.  ?Cardiovascular: Normal rate, regular rhythm, normal heart sounds and intact distal pulses.   ?Pulmonary/Chest: Effort normal and breath sounds normal. No respiratory distress. No wheezes. No rales.  ?Abdominal: Soft. Bowel sounds are normal. Exhibits no distension and no mass. There is no tenderness.  ?Musculoskeletal: Normal range of motion. Exhibits no edema.  ?Lymphadenopathy:  ?  No cervical adenopathy.  ?Neurological: Alert and oriented to person, place, and time. Exhibits normal muscle tone. Gait normal. Coordination normal.  ?Skin: Skin is warm and dry. No rash noted. Not diaphoretic. No erythema.  No pallor.  ?Psychiatric: Mood, memory and judgment normal.  ?Vitals reviewed. ? ?LABORATORY DATA: ?Lab Results  ?Component Value Date  ? WBC 6.5 01/08/2022  ? HGB 13.9 01/08/2022  ? HCT 41.6 01/08/2022  ? MCV 90.4 01/08/2022  ? PLT 256 01/08/2022  ? ? ?  Chemistry   ?   ?Component Value Date/Time  ? NA 137 01/08/2022 1328  ? K 4.4 01/08/2022 1328  ? CL 106 01/08/2022 1328  ? CO2 23 01/08/2022 1328  ? BUN 27 (H) 01/08/2022 1328  ? CREATININE 1.60 (H) 01/08/2022 1328  ?    ?Component Value Date/Time  ? CALCIUM 9.3 01/08/2022 1328  ? ALKPHOS 51 01/08/2022 1328  ? AST 23 01/08/2022 1328  ? ALT 27 01/08/2022 1328  ? BILITOT 0.6 01/08/2022 1328  ?  ? ? ? ?RADIOGRAPHIC STUDIES: ?No results found. ? ?ASSESSMENT: This is a very pleasant 87 year old Caucasian male referred to the clinic for monocytosis. ? ?The patient was seen with Dr. Julien Nordmann today.  The patient had a CBC, CMP, and LDH performed today.  The patient CBC is completely normal today with normal  white blood cell count, monocyte count, eosinophil count, and neutrophil count.  The patient's creatinine is elevated at 1.6 today.  Dr. Julien Nordmann recommended that he continue to follow with his family doctor clo

## 2022-01-07 ENCOUNTER — Other Ambulatory Visit: Payer: Self-pay | Admitting: Physician Assistant

## 2022-01-07 DIAGNOSIS — D72821 Monocytosis (symptomatic): Secondary | ICD-10-CM

## 2022-01-08 ENCOUNTER — Inpatient Hospital Stay (HOSPITAL_BASED_OUTPATIENT_CLINIC_OR_DEPARTMENT_OTHER): Payer: Medicare PPO | Admitting: Physician Assistant

## 2022-01-08 ENCOUNTER — Other Ambulatory Visit: Payer: Self-pay

## 2022-01-08 ENCOUNTER — Inpatient Hospital Stay: Payer: Medicare PPO | Attending: Physician Assistant

## 2022-01-08 DIAGNOSIS — Z87891 Personal history of nicotine dependence: Secondary | ICD-10-CM | POA: Insufficient documentation

## 2022-01-08 DIAGNOSIS — M1990 Unspecified osteoarthritis, unspecified site: Secondary | ICD-10-CM | POA: Diagnosis not present

## 2022-01-08 DIAGNOSIS — K219 Gastro-esophageal reflux disease without esophagitis: Secondary | ICD-10-CM | POA: Diagnosis not present

## 2022-01-08 DIAGNOSIS — E079 Disorder of thyroid, unspecified: Secondary | ICD-10-CM | POA: Diagnosis not present

## 2022-01-08 DIAGNOSIS — Z79899 Other long term (current) drug therapy: Secondary | ICD-10-CM | POA: Diagnosis not present

## 2022-01-08 DIAGNOSIS — E785 Hyperlipidemia, unspecified: Secondary | ICD-10-CM | POA: Diagnosis not present

## 2022-01-08 DIAGNOSIS — I4891 Unspecified atrial fibrillation: Secondary | ICD-10-CM | POA: Diagnosis not present

## 2022-01-08 DIAGNOSIS — I110 Hypertensive heart disease with heart failure: Secondary | ICD-10-CM | POA: Diagnosis not present

## 2022-01-08 DIAGNOSIS — D72821 Monocytosis (symptomatic): Secondary | ICD-10-CM | POA: Diagnosis not present

## 2022-01-08 DIAGNOSIS — Z7901 Long term (current) use of anticoagulants: Secondary | ICD-10-CM | POA: Insufficient documentation

## 2022-01-08 LAB — CMP (CANCER CENTER ONLY)
ALT: 27 U/L (ref 0–44)
AST: 23 U/L (ref 15–41)
Albumin: 4.1 g/dL (ref 3.5–5.0)
Alkaline Phosphatase: 51 U/L (ref 38–126)
Anion gap: 8 (ref 5–15)
BUN: 27 mg/dL — ABNORMAL HIGH (ref 8–23)
CO2: 23 mmol/L (ref 22–32)
Calcium: 9.3 mg/dL (ref 8.9–10.3)
Chloride: 106 mmol/L (ref 98–111)
Creatinine: 1.6 mg/dL — ABNORMAL HIGH (ref 0.61–1.24)
GFR, Estimated: 42 mL/min — ABNORMAL LOW (ref >60)
Glucose, Bld: 108 mg/dL — ABNORMAL HIGH (ref 70–99)
Potassium: 4.4 mmol/L (ref 3.5–5.1)
Sodium: 137 mmol/L (ref 135–145)
Total Bilirubin: 0.6 mg/dL (ref 0.3–1.2)
Total Protein: 7 g/dL (ref 6.5–8.1)

## 2022-01-08 LAB — CBC WITH DIFFERENTIAL (CANCER CENTER ONLY)
Abs Immature Granulocytes: 0.02 10*3/uL (ref 0.00–0.07)
Basophils Absolute: 0.1 10*3/uL (ref 0.0–0.1)
Basophils Relative: 1 %
Eosinophils Absolute: 0.5 10*3/uL (ref 0.0–0.5)
Eosinophils Relative: 7 %
HCT: 41.6 % (ref 39.0–52.0)
Hemoglobin: 13.9 g/dL (ref 13.0–17.0)
Immature Granulocytes: 0 %
Lymphocytes Relative: 23 %
Lymphs Abs: 1.5 10*3/uL (ref 0.7–4.0)
MCH: 30.2 pg (ref 26.0–34.0)
MCHC: 33.4 g/dL (ref 30.0–36.0)
MCV: 90.4 fL (ref 80.0–100.0)
Monocytes Absolute: 0.8 10*3/uL (ref 0.1–1.0)
Monocytes Relative: 13 %
Neutro Abs: 3.7 10*3/uL (ref 1.7–7.7)
Neutrophils Relative %: 56 %
Platelet Count: 256 10*3/uL (ref 150–400)
RBC: 4.6 MIL/uL (ref 4.22–5.81)
RDW: 13.6 % (ref 11.5–15.5)
WBC Count: 6.5 10*3/uL (ref 4.0–10.5)
nRBC: 0 % (ref 0.0–0.2)

## 2022-01-08 LAB — LACTATE DEHYDROGENASE: LDH: 175 U/L (ref 98–192)

## 2022-10-12 DIAGNOSIS — L570 Actinic keratosis: Secondary | ICD-10-CM | POA: Diagnosis not present

## 2022-10-12 DIAGNOSIS — D225 Melanocytic nevi of trunk: Secondary | ICD-10-CM | POA: Diagnosis not present

## 2022-10-12 DIAGNOSIS — C44712 Basal cell carcinoma of skin of right lower limb, including hip: Secondary | ICD-10-CM | POA: Diagnosis not present

## 2022-10-12 DIAGNOSIS — L814 Other melanin hyperpigmentation: Secondary | ICD-10-CM | POA: Diagnosis not present

## 2022-10-12 DIAGNOSIS — Z85828 Personal history of other malignant neoplasm of skin: Secondary | ICD-10-CM | POA: Diagnosis not present

## 2022-10-12 DIAGNOSIS — L821 Other seborrheic keratosis: Secondary | ICD-10-CM | POA: Diagnosis not present

## 2022-10-12 DIAGNOSIS — C4441 Basal cell carcinoma of skin of scalp and neck: Secondary | ICD-10-CM | POA: Diagnosis not present

## 2022-10-12 DIAGNOSIS — B078 Other viral warts: Secondary | ICD-10-CM | POA: Diagnosis not present

## 2022-10-22 ENCOUNTER — Telehealth: Payer: Self-pay

## 2022-10-22 NOTE — Patient Outreach (Signed)
  Care Coordination   10/22/2022 Name: Torres Hardenbrook MRN: [id number] DOB: 10-Dec-1934   Care Coordination Outreach Attempts:  An unsuccessful telephone outreach was attempted today to offer the patient information about available care coordination services as a benefit of their health plan.   Follow Up Plan:  Additional outreach attempts will be made to offer the patient care coordination information and services.   Encounter Outcome:  No Answer   Care Coordination Interventions:  No, not indicated    Peter Garter RN, BSN,CCM, CDE Care Management Coordinator [facility] [phone]

## 2022-11-03 ENCOUNTER — Telehealth: Payer: Self-pay

## 2022-11-03 NOTE — Patient Outreach (Signed)
  Care Coordination   Initial Visit Note   11/03/2022 Name: Jeramy Dimmick MRN: [id number] DOB: Mar 22, 1935  Jacquelyn Antony is a 88 y.o. year old male who sees Lawerance Cruel, MD for primary care. I spoke with  Williemae Area by phone today.  What matters to the patients health and wellness today?  No concerns today.  I am in very good health     Goals Addressed             This Visit's Progress    COMPLETED: Care Coordination Activities - no follow up required       Care Coordination Interventions: Provided education to patient re: care coordination services, annual wellness visit Assessed social determinant of health barriers          SDOH assessments and interventions completed:  Yes  SDOH Interventions Today    Flowsheet Row Most Recent Value  SDOH Interventions   Food Insecurity Interventions Intervention Not Indicated  Housing Interventions Intervention Not Indicated  Transportation Interventions Intervention Not Indicated  Utilities Interventions Intervention Not Indicated  Physical Activity Interventions Intervention Not Indicated        Care Coordination Interventions:  Yes, provided   Follow up plan: No further intervention required.   Encounter Outcome:  Pt. Visit Completed  Peter Garter RN, BSN,CCM, CDE Care Management Coordinator [facility] [phone]

## 2022-12-21 DIAGNOSIS — I10 Essential (primary) hypertension: Secondary | ICD-10-CM | POA: Diagnosis not present

## 2022-12-21 DIAGNOSIS — E7800 Pure hypercholesterolemia, unspecified: Secondary | ICD-10-CM | POA: Diagnosis not present

## 2022-12-21 DIAGNOSIS — Z125 Encounter for screening for malignant neoplasm of prostate: Secondary | ICD-10-CM | POA: Diagnosis not present

## 2022-12-21 DIAGNOSIS — Z79899 Other long term (current) drug therapy: Secondary | ICD-10-CM | POA: Diagnosis not present

## 2022-12-23 DIAGNOSIS — I700 Atherosclerosis of aorta: Secondary | ICD-10-CM | POA: Diagnosis not present

## 2022-12-23 DIAGNOSIS — Z0000 Encounter for general adult medical examination without abnormal findings: Secondary | ICD-10-CM | POA: Diagnosis not present

## 2022-12-23 DIAGNOSIS — I10 Essential (primary) hypertension: Secondary | ICD-10-CM | POA: Diagnosis not present

## 2022-12-23 DIAGNOSIS — Z85828 Personal history of other malignant neoplasm of skin: Secondary | ICD-10-CM | POA: Diagnosis not present

## 2022-12-23 DIAGNOSIS — E7800 Pure hypercholesterolemia, unspecified: Secondary | ICD-10-CM | POA: Diagnosis not present

## 2022-12-23 DIAGNOSIS — C4441 Basal cell carcinoma of skin of scalp and neck: Secondary | ICD-10-CM | POA: Diagnosis not present

## 2022-12-23 DIAGNOSIS — I480 Paroxysmal atrial fibrillation: Secondary | ICD-10-CM | POA: Diagnosis not present

## 2022-12-23 DIAGNOSIS — Z6828 Body mass index (BMI) 28.0-28.9, adult: Secondary | ICD-10-CM | POA: Diagnosis not present

## 2022-12-23 DIAGNOSIS — N183 Chronic kidney disease, stage 3 unspecified: Secondary | ICD-10-CM | POA: Diagnosis not present

## 2022-12-23 DIAGNOSIS — I714 Abdominal aortic aneurysm, without rupture, unspecified: Secondary | ICD-10-CM | POA: Diagnosis not present

## 2022-12-23 DIAGNOSIS — D6869 Other thrombophilia: Secondary | ICD-10-CM | POA: Diagnosis not present

## 2023-01-04 DIAGNOSIS — H353131 Nonexudative age-related macular degeneration, bilateral, early dry stage: Secondary | ICD-10-CM | POA: Diagnosis not present

## 2023-02-18 DIAGNOSIS — Z6827 Body mass index (BMI) 27.0-27.9, adult: Secondary | ICD-10-CM | POA: Diagnosis not present

## 2023-02-18 DIAGNOSIS — H9201 Otalgia, right ear: Secondary | ICD-10-CM | POA: Diagnosis not present

## 2023-10-13 DIAGNOSIS — D692 Other nonthrombocytopenic purpura: Secondary | ICD-10-CM | POA: Diagnosis not present

## 2023-10-13 DIAGNOSIS — L814 Other melanin hyperpigmentation: Secondary | ICD-10-CM | POA: Diagnosis not present

## 2023-10-13 DIAGNOSIS — C4442 Squamous cell carcinoma of skin of scalp and neck: Secondary | ICD-10-CM | POA: Diagnosis not present

## 2023-10-13 DIAGNOSIS — L820 Inflamed seborrheic keratosis: Secondary | ICD-10-CM | POA: Diagnosis not present

## 2023-10-13 DIAGNOSIS — L570 Actinic keratosis: Secondary | ICD-10-CM | POA: Diagnosis not present

## 2023-10-13 DIAGNOSIS — L821 Other seborrheic keratosis: Secondary | ICD-10-CM | POA: Diagnosis not present

## 2023-10-13 DIAGNOSIS — D2262 Melanocytic nevi of left upper limb, including shoulder: Secondary | ICD-10-CM | POA: Diagnosis not present

## 2023-10-13 DIAGNOSIS — C44229 Squamous cell carcinoma of skin of left ear and external auricular canal: Secondary | ICD-10-CM | POA: Diagnosis not present

## 2023-10-13 DIAGNOSIS — Z85828 Personal history of other malignant neoplasm of skin: Secondary | ICD-10-CM | POA: Diagnosis not present

## 2023-11-24 DIAGNOSIS — L723 Sebaceous cyst: Secondary | ICD-10-CM | POA: Diagnosis not present

## 2023-12-08 DIAGNOSIS — Z6827 Body mass index (BMI) 27.0-27.9, adult: Secondary | ICD-10-CM | POA: Diagnosis not present

## 2023-12-08 DIAGNOSIS — R197 Diarrhea, unspecified: Secondary | ICD-10-CM | POA: Diagnosis not present

## 2023-12-08 DIAGNOSIS — C4490 Unspecified malignant neoplasm of skin, unspecified: Secondary | ICD-10-CM | POA: Diagnosis not present

## 2023-12-13 DIAGNOSIS — C4442 Squamous cell carcinoma of skin of scalp and neck: Secondary | ICD-10-CM | POA: Diagnosis not present

## 2023-12-13 DIAGNOSIS — D485 Neoplasm of uncertain behavior of skin: Secondary | ICD-10-CM | POA: Diagnosis not present

## 2023-12-13 DIAGNOSIS — Z85828 Personal history of other malignant neoplasm of skin: Secondary | ICD-10-CM | POA: Diagnosis not present

## 2023-12-13 DIAGNOSIS — C44529 Squamous cell carcinoma of skin of other part of trunk: Secondary | ICD-10-CM | POA: Diagnosis not present

## 2023-12-27 DIAGNOSIS — M25561 Pain in right knee: Secondary | ICD-10-CM | POA: Diagnosis not present

## 2024-01-03 DIAGNOSIS — M25561 Pain in right knee: Secondary | ICD-10-CM | POA: Diagnosis not present

## 2024-01-24 DIAGNOSIS — M25561 Pain in right knee: Secondary | ICD-10-CM | POA: Diagnosis not present

## 2024-01-25 DIAGNOSIS — Z7901 Long term (current) use of anticoagulants: Secondary | ICD-10-CM | POA: Diagnosis not present

## 2024-01-25 DIAGNOSIS — N529 Male erectile dysfunction, unspecified: Secondary | ICD-10-CM | POA: Diagnosis not present

## 2024-01-25 DIAGNOSIS — E785 Hyperlipidemia, unspecified: Secondary | ICD-10-CM | POA: Diagnosis not present

## 2024-01-25 DIAGNOSIS — N189 Chronic kidney disease, unspecified: Secondary | ICD-10-CM | POA: Diagnosis not present

## 2024-01-25 DIAGNOSIS — D6869 Other thrombophilia: Secondary | ICD-10-CM | POA: Diagnosis not present

## 2024-01-25 DIAGNOSIS — M1990 Unspecified osteoarthritis, unspecified site: Secondary | ICD-10-CM | POA: Diagnosis not present

## 2024-01-25 DIAGNOSIS — I129 Hypertensive chronic kidney disease with stage 1 through stage 4 chronic kidney disease, or unspecified chronic kidney disease: Secondary | ICD-10-CM | POA: Diagnosis not present

## 2024-01-25 DIAGNOSIS — I4891 Unspecified atrial fibrillation: Secondary | ICD-10-CM | POA: Diagnosis not present

## 2024-01-25 DIAGNOSIS — I700 Atherosclerosis of aorta: Secondary | ICD-10-CM | POA: Diagnosis not present

## 2024-04-14 DIAGNOSIS — J310 Chronic rhinitis: Secondary | ICD-10-CM | POA: Diagnosis not present

## 2024-04-14 DIAGNOSIS — R051 Acute cough: Secondary | ICD-10-CM | POA: Diagnosis not present

## 2024-11-28 ENCOUNTER — Other Ambulatory Visit: Payer: Self-pay

## 2024-11-28 DIAGNOSIS — I480 Paroxysmal atrial fibrillation: Secondary | ICD-10-CM

## 2024-11-28 MED ORDER — ELIQUIS 5 MG PO TABS: 5.0000 mg | ORAL_TABLET | Freq: Two times a day (BID) | ORAL | 0 refills | Status: DC

## 2024-11-30 ENCOUNTER — Other Ambulatory Visit: Payer: Self-pay | Admitting: Cardiology

## 2024-11-30 DIAGNOSIS — I480 Paroxysmal atrial fibrillation: Secondary | ICD-10-CM

## 2024-11-30 MED ORDER — ELIQUIS 5 MG PO TABS: 5.0000 mg | ORAL_TABLET | Freq: Two times a day (BID) | ORAL | 0 refills | Status: AC

## 2024-12-11 ENCOUNTER — Inpatient Hospital Stay: Admitting: Internal Medicine

## 2024-12-11 ENCOUNTER — Inpatient Hospital Stay
